# Patient Record
Sex: Female | Born: 1960 | Race: White | Hispanic: No | Marital: Married | Smoking: Never smoker
Health system: Southern US, Community
[De-identification: ages and names within clinical notes are randomized; demographics above are authoritative.]

---

## 1998-04-29 ENCOUNTER — Inpatient Hospital Stay (HOSPITAL_COMMUNITY): Admission: AD | Admit: 1998-04-29 | Discharge: 1998-05-02 | Payer: Self-pay | Admitting: Obstetrics and Gynecology

## 1998-05-02 ENCOUNTER — Encounter (HOSPITAL_COMMUNITY): Admission: RE | Admit: 1998-05-02 | Discharge: 1998-07-31 | Payer: Self-pay | Admitting: *Deleted

## 1998-06-05 ENCOUNTER — Other Ambulatory Visit: Admission: RE | Admit: 1998-06-05 | Discharge: 1998-06-05 | Payer: Self-pay | Admitting: Obstetrics and Gynecology

## 1999-11-12 ENCOUNTER — Other Ambulatory Visit: Admission: RE | Admit: 1999-11-12 | Discharge: 1999-11-12 | Payer: Self-pay | Admitting: Obstetrics and Gynecology

## 2000-02-23 ENCOUNTER — Emergency Department (HOSPITAL_COMMUNITY): Admission: EM | Admit: 2000-02-23 | Discharge: 2000-02-24 | Payer: Self-pay | Admitting: Emergency Medicine

## 2000-12-09 ENCOUNTER — Other Ambulatory Visit: Admission: RE | Admit: 2000-12-09 | Discharge: 2000-12-09 | Payer: Self-pay | Admitting: Obstetrics and Gynecology

## 2002-04-09 ENCOUNTER — Other Ambulatory Visit: Admission: RE | Admit: 2002-04-09 | Discharge: 2002-04-09 | Payer: Self-pay | Admitting: Obstetrics and Gynecology

## 2005-03-24 ENCOUNTER — Other Ambulatory Visit: Admission: RE | Admit: 2005-03-24 | Discharge: 2005-03-24 | Payer: Self-pay | Admitting: Obstetrics and Gynecology

## 2009-01-30 ENCOUNTER — Ambulatory Visit: Payer: Self-pay | Admitting: Sports Medicine

## 2009-01-30 DIAGNOSIS — M216X9 Other acquired deformities of unspecified foot: Secondary | ICD-10-CM | POA: Insufficient documentation

## 2009-01-30 DIAGNOSIS — M722 Plantar fascial fibromatosis: Secondary | ICD-10-CM | POA: Insufficient documentation

## 2009-03-17 ENCOUNTER — Ambulatory Visit: Payer: Self-pay | Admitting: Sports Medicine

## 2009-05-29 ENCOUNTER — Ambulatory Visit: Payer: Self-pay | Admitting: Sports Medicine

## 2010-01-21 DIAGNOSIS — R42 Dizziness and giddiness: Secondary | ICD-10-CM | POA: Insufficient documentation

## 2010-01-27 ENCOUNTER — Ambulatory Visit: Payer: Self-pay | Admitting: Sports Medicine

## 2010-03-17 NOTE — Assessment & Plan Note (Signed)
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 that did ice each evening  now doing strength work 3x week opposite days runs 3 x week  pain level now 3/10  cont to do exercises and stretches she is very consistent w this  Allergies: No Known Drug Allergies  Physical Exam  General:  Well-developed,well-nourished,in no acute distress; alert,appropriate and cooperative throughout examination Msk:  TTP only on 1 spot of medial heel on RT good motion arches are norm w slt pronation on RT Additional Exam:  MSK [place] The RT PF looks improved no calcification now down to 0.46 vs LT of 0.38 On last exam was 0.54 there is still some area of hypoechoic change  images saved   Impression & Recommendations:  Problem # 1:  PLANTAR FASCIITIS, RIGHT (ICD-728.71)  cont w exercise program cont icing cont stretches  grad build running   reck w repeat scan in 3 mos or so  Orders: [place] LIMITED (16109)  Problem # 2:  CAVUS DEFORMITY OF FOOT, 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) has good OTC insert she can cont with this  running form looks good in these  Patient Instructions: 1)  Ok to keep running 2)  keep up exercises for PF 3)  keep up icing 4)  Hal Higdon training schedules 5)  Rogers Blocker training schedules 6)  focus on beginning runners 7)  Basic principles: 8)  If you are at 30 mins the following would be good 9)  one day 30 mins - with 10 mins slightly faster than typical pace 10)  one day is 25 minurtes at easy pace 11)  one longer run - pace is not important but start at 40 mins and add 5 minutes every 2 weeks 12)  reck in 3 mos 13)  during that time if your pain level increase back down to  previous training level that was not painful

## 2010-03-17 NOTE — Assessment & Plan Note (Signed)
Summary: 4:00 APPT,dizziness while running per [place],[facility]   Vital Signs:  Patient profile:   50 year old female Pulse rate:   64 / minute BP sitting:   115 / 84  (left arm)  Vitals Entered By: Rochele Pages RN (January 21, 2010 4:02 PM) CC: dizziness and vomiting started during exercise   CC:  dizziness and vomiting started during exercise.  History of Present Illness: Pt presents to clinic for evaluation of severe dizziness that started during a boot camp class at 6 am.  She had not eaten breakfast.   Felt everything was spinning severely, and then she started vomiting.  The vomiting lasted for 12 hours.  BP at 9am that morning was 101/67 and blood sugar was 123- she had drank OJ when vomiting started- but vomited the OJ.  She had been driving from [facility] the day before the boot camp, and had not had much water that day while driving.   Dizziness continued- she saw Dr. Lambert Mody who did adjustment for BPPV and advised her to sleep in recliner for 2 days.  Symptoms seemed to improve, but not completely resolved.  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  These movements induced the dizziness.     Lying down on rt side improves symptoms.    Allergies: No Known Drug Allergies  Physical Exam  General:  Well-developed,well-nourished,in no acute distress; alert,appropriate and cooperative throughout examination Msk:  Negative Halpike maneuvers Slight dizziness at first when lying down Minor nystagmus with rapid eye movement Turning equilibrim exam negative Toe and heel walk with eyes open and eyes closed negative. No other abnormal neurological findings.   normal ENT exam   Impression & Recommendations:  Problem # 1:  DIZZINESS (ICD-780.4) I saw nothing on exam to suggest a serious cause for this could have BPV but does not seem liekly w norm exam today no neuro deficits  suggested watchful waiting whther triggered by dehydration or by viral process  would expect this to imporve by 14 days out  will reck if not  Patient Instructions: 1)  Hydrate with gatorade 2)  Do moderate walking or moderate running for the next week keeping heart rate in the 120s, do no more than 2 miles.  If heart races stop.    Orders Added: 1)  Est. Patient Level III [16109]

## 2010-03-17 NOTE — Assessment & Plan Note (Signed)
Summary: F/U R PLANTAR FASCIITIS,MC   Vital Signs:  Patient profile:   50 year old female BP sitting:   113 / 80  Vitals Entered By: Lillia Pauls CMA (March 17, 2009 8:46 AM)  History of Present Illness: 50 yo here for follow-up of right plantar fasciitis.  Seen 6 weeks ago at which time US showed thickening of right planatr fascia with calcifications.  Equivocal to slightly better.  Has been icing twice daily.  Doing "boot camp" for the past several weeks.  Has changed to new firmer superfeet insole with no improvement.  Not taking any meds.  Most pain with calf exercises.   However, she has beena ble to run up to a mile to 1.5 miles at boot camp; able to do most exercises; calf raises hurt the PF as does aerobic class; holding on aerobics.  Allergies: No Known Drug Allergies  Review of Systems      See HPI MS:  Denies joint pain and loss of strength.  Physical Exam  General:  Well-developed,well-nourished,in no acute distress; alert,appropriate and cooperative throughout examination Msk:  Patient is mildly tender at medial insertion of plantar fascia into calcaneus.  Gait with less pushoff right side-  improved with warmup, but overall normal. No limp.   Impression & Recommendations:  Problem # 1:  PLANTAR FASCIITIS, RIGHT (ICD-728.71)  No need for injection today as patient is improving.  Given cushioned comfort orthotic today.  Advised to continue exercise to a low pain level (3/10), continue icing and strethcing exercises.  Return in 2 months fo recheck.  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

## 2013-08-03 ENCOUNTER — Other Ambulatory Visit: Payer: Self-pay | Admitting: Physician Assistant

## 2013-08-03 DIAGNOSIS — R1013 Epigastric pain: Secondary | ICD-10-CM

## 2013-08-08 ENCOUNTER — Ambulatory Visit
Admission: RE | Admit: 2013-08-08 | Discharge: 2013-08-08 | Disposition: A | Discharge status: Home / Self Care | Payer: BC Managed Care – PPO | Source: Ambulatory Visit | Attending: Physician Assistant | Admitting: Physician Assistant

## 2013-08-08 DIAGNOSIS — R1013 Epigastric pain: Secondary | ICD-10-CM

## 2015-11-05 DIAGNOSIS — M25519 Pain in unspecified shoulder: Secondary | ICD-10-CM | POA: Diagnosis not present

## 2015-11-05 DIAGNOSIS — L409 Psoriasis, unspecified: Secondary | ICD-10-CM | POA: Diagnosis not present

## 2015-11-06 DIAGNOSIS — M7541 Impingement syndrome of right shoulder: Secondary | ICD-10-CM | POA: Diagnosis not present

## 2015-11-06 DIAGNOSIS — M25551 Pain in right hip: Secondary | ICD-10-CM | POA: Diagnosis not present

## 2015-11-13 DIAGNOSIS — M25511 Pain in right shoulder: Secondary | ICD-10-CM | POA: Diagnosis not present

## 2015-11-13 DIAGNOSIS — M7541 Impingement syndrome of right shoulder: Secondary | ICD-10-CM | POA: Diagnosis not present

## 2015-11-20 DIAGNOSIS — M25511 Pain in right shoulder: Secondary | ICD-10-CM | POA: Diagnosis not present

## 2015-11-20 DIAGNOSIS — M7541 Impingement syndrome of right shoulder: Secondary | ICD-10-CM | POA: Diagnosis not present

## 2016-02-26 IMAGING — US US ABDOMEN COMPLETE
1 series · 14 of 25 positions shown · non-contrast
Comparison: None.

CLINICAL DATA: Epigastric abdominal pain

EXAM:
ULTRASOUND ABDOMEN COMPLETE

[Series 1: us abdomen complete · 0.29mm/px · 14 of 92 slices shown]
[im 1/92]
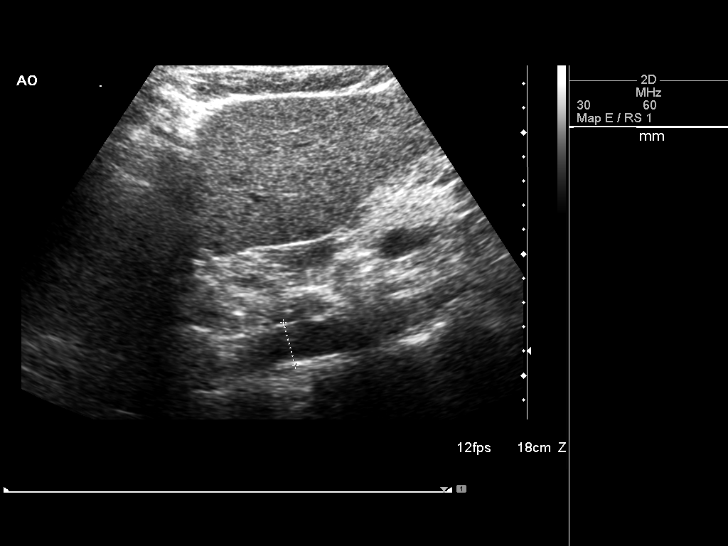
[im 8/92]
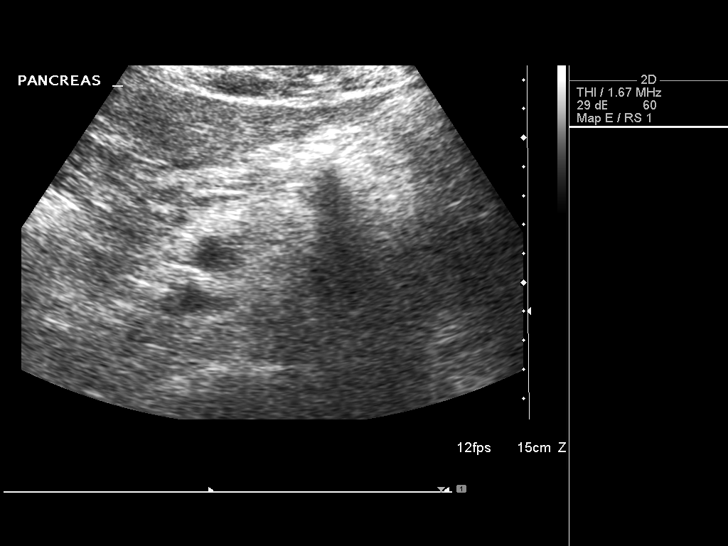
[im 16/92]
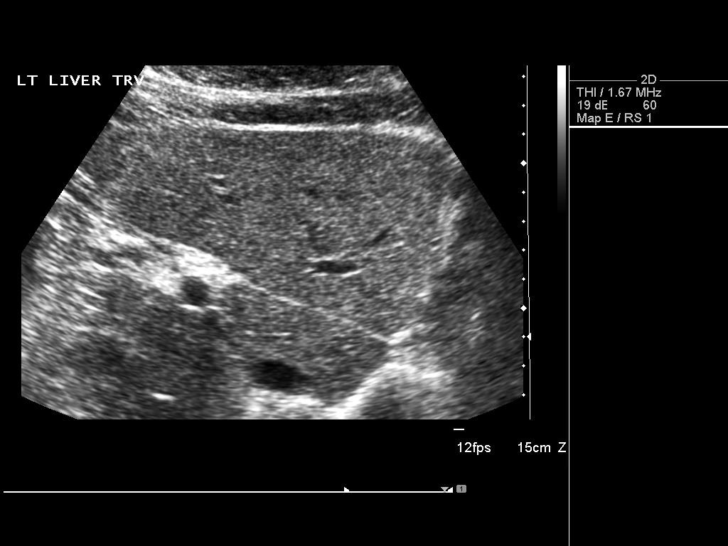
[im 23/92]
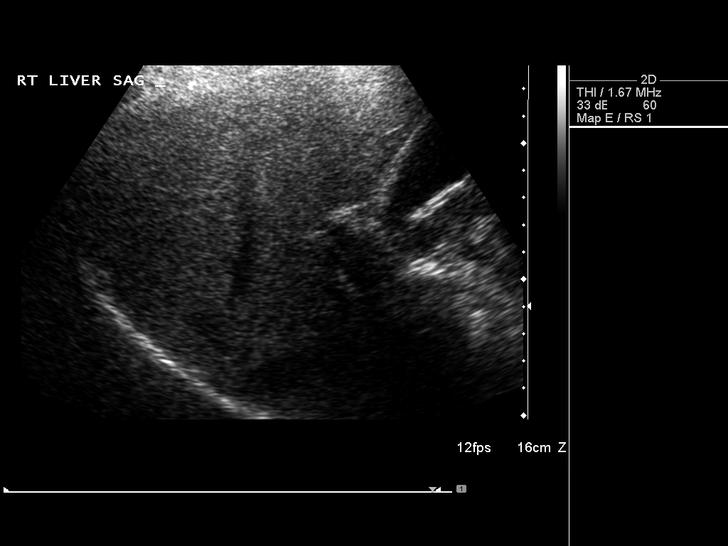
[im 31/92]
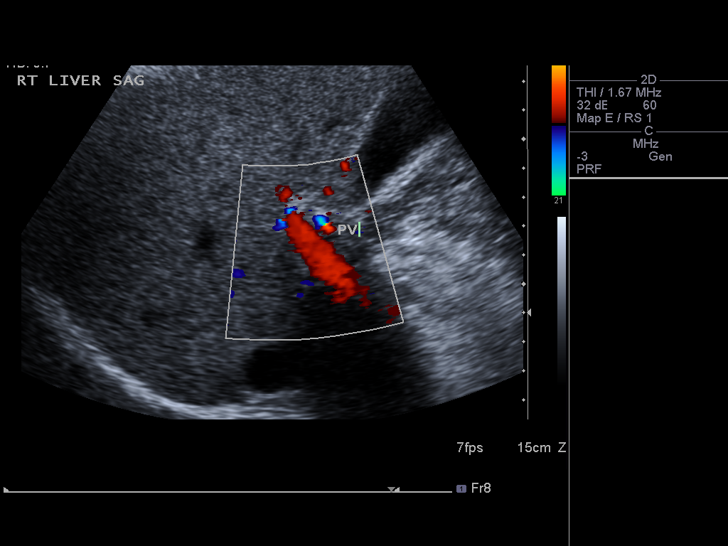
[im 35/92]
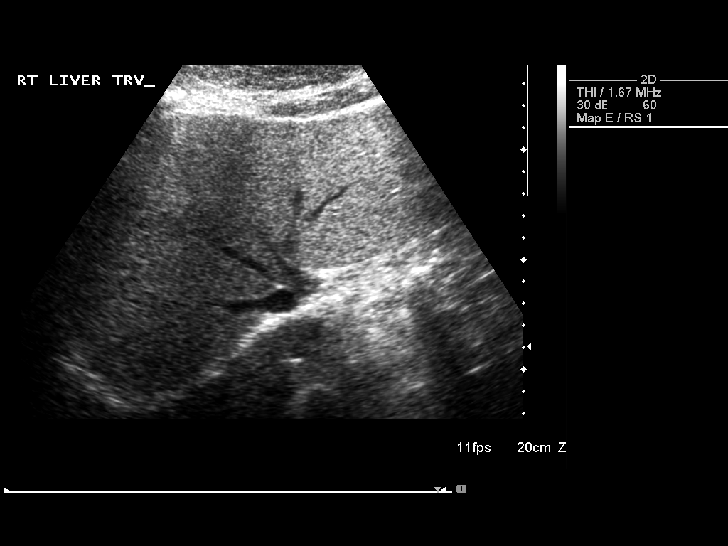
[im 42/92]
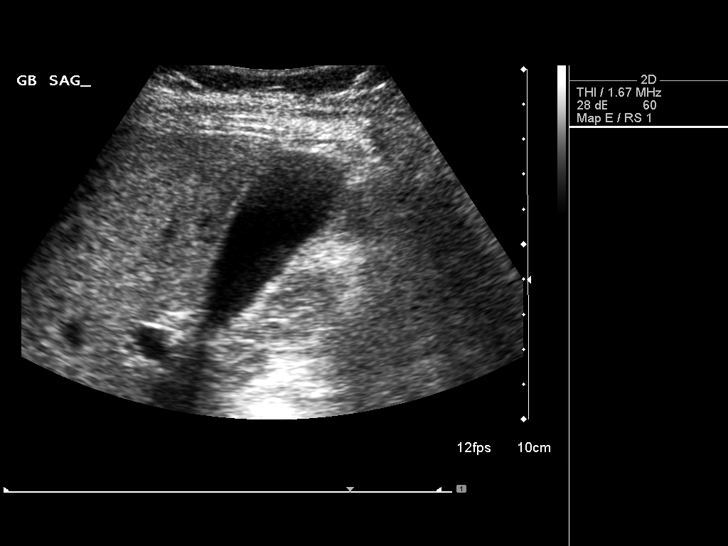
[im 50/92]
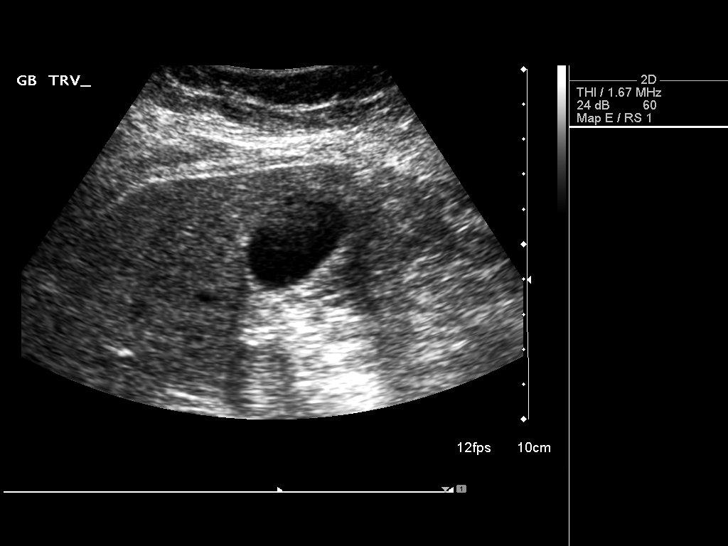
[im 57/92]
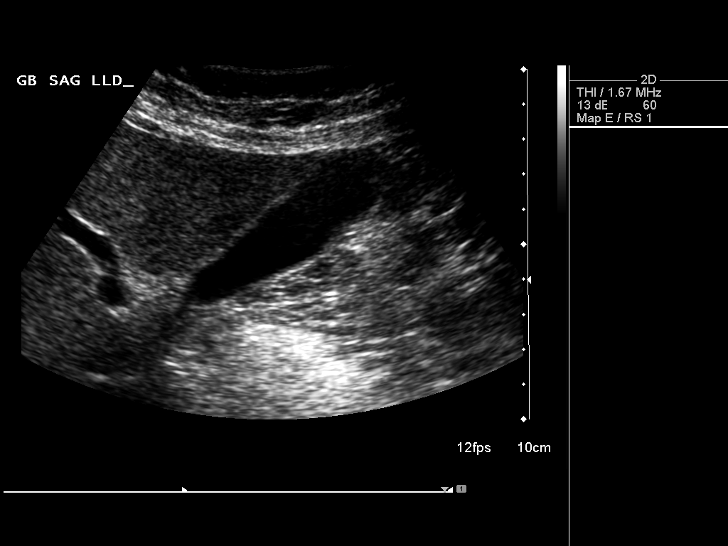
[im 61/92]
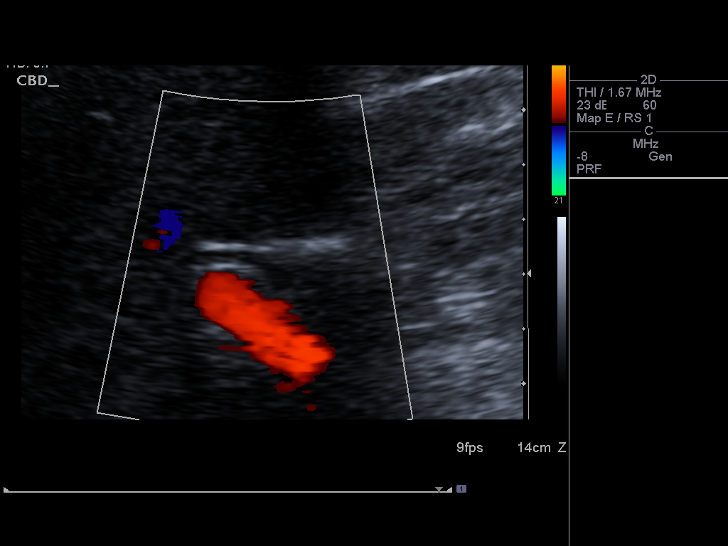
[im 69/92]
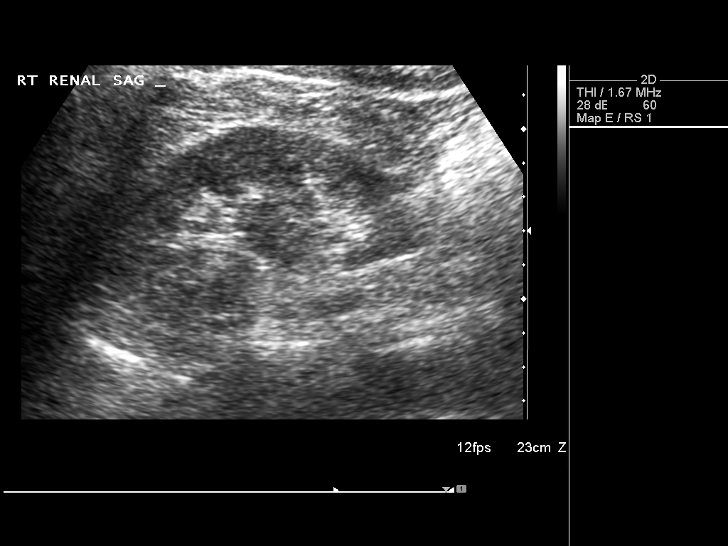
[im 76/92]
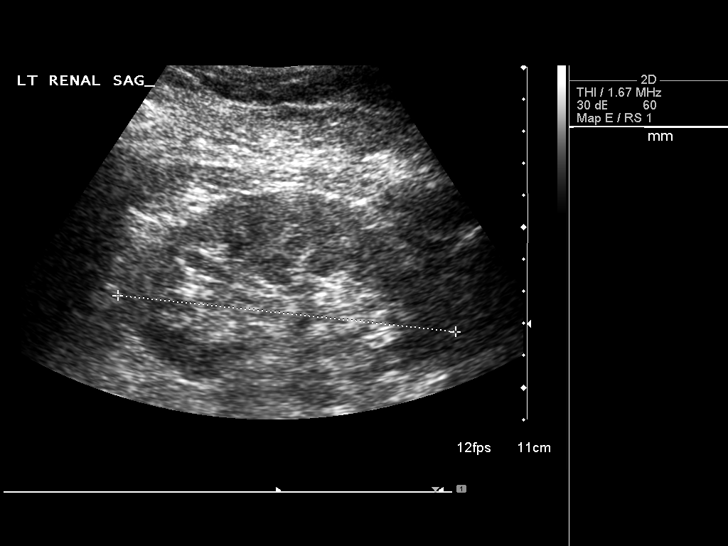
[im 84/92]
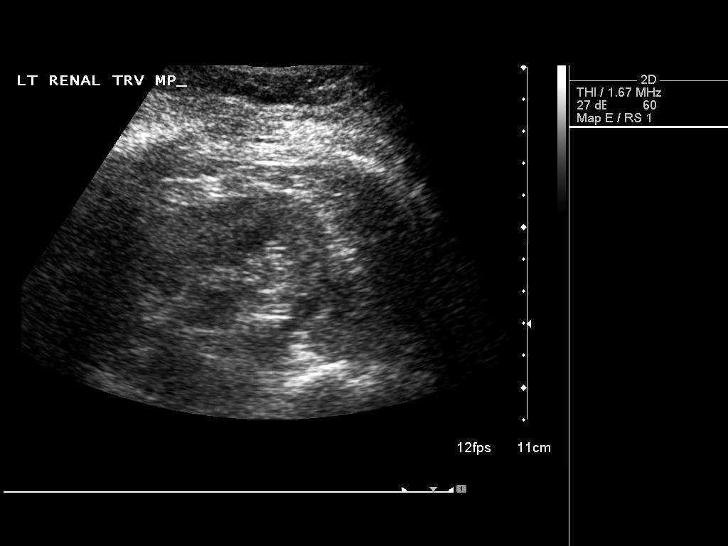
[im 92/92]
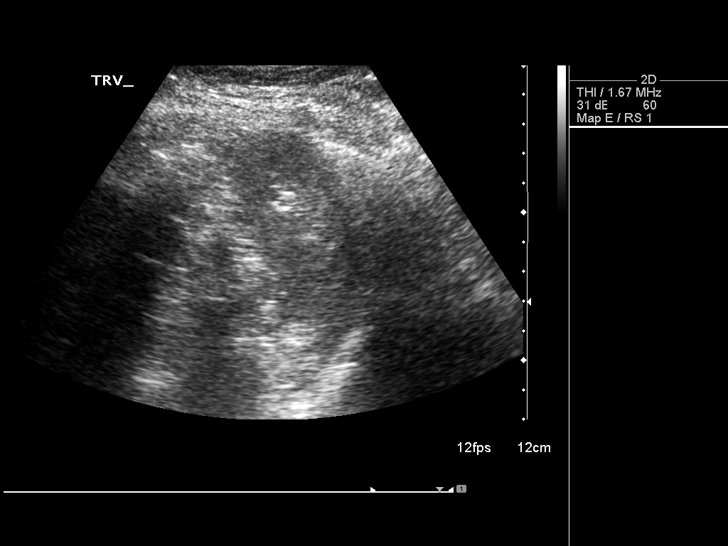

[14 of 25 positions shown; findings below may reference images not displayed]

FINDINGS: Gallbladder:

The gallbladder is visualized and no gallstones are noted. There is
no pain over the gallbladder with compression.

Common bile duct:

Diameter: The common bile duct is normal measuring 4.7 mm in
diameter.

Liver:

The liver has a normal echogenic pattern. No focal abnormality is
seen.

IVC:

No abnormality visualized.

Pancreas:

The pancreas appears normal in the midbody with portions of the head
and tail of the pancreas obscured by bowel gas.

Spleen:

The spleen is normal measuring 4.3 cm sagittally.

Right Kidney:

Length: 9.6 cm..  No hydronephrosis is seen.

Left Kidney:

Length: 10.6 cm..  No hydronephrosis is noted.

Abdominal aorta:

The abdominal aorta is normal caliber.

Other findings:

None.
IMPRESSION: Negative abdominal ultrasound. Portions of the pancreas are obscured
by bowel gas.

## 2016-12-20 DIAGNOSIS — L409 Psoriasis, unspecified: Secondary | ICD-10-CM | POA: Diagnosis not present

## 2017-01-04 DIAGNOSIS — L400 Psoriasis vulgaris: Secondary | ICD-10-CM | POA: Diagnosis not present

## 2017-01-28 DIAGNOSIS — Z1231 Encounter for screening mammogram for malignant neoplasm of breast: Secondary | ICD-10-CM | POA: Diagnosis not present

## 2017-05-31 DIAGNOSIS — L409 Psoriasis, unspecified: Secondary | ICD-10-CM | POA: Diagnosis not present

## 2017-05-31 DIAGNOSIS — R490 Dysphonia: Secondary | ICD-10-CM | POA: Diagnosis not present

## 2018-07-12 DIAGNOSIS — Z03818 Encounter for observation for suspected exposure to other biological agents ruled out: Secondary | ICD-10-CM | POA: Diagnosis not present

## 2019-02-02 DIAGNOSIS — B349 Viral infection, unspecified: Secondary | ICD-10-CM | POA: Diagnosis not present

## 2019-02-14 DIAGNOSIS — R05 Cough: Secondary | ICD-10-CM | POA: Diagnosis not present

## 2019-02-15 DIAGNOSIS — R0789 Other chest pain: Secondary | ICD-10-CM | POA: Diagnosis not present

## 2019-03-14 DIAGNOSIS — Z209 Contact with and (suspected) exposure to unspecified communicable disease: Secondary | ICD-10-CM | POA: Diagnosis not present

## 2019-09-10 DIAGNOSIS — Z23 Encounter for immunization: Secondary | ICD-10-CM | POA: Diagnosis not present

## 2019-11-16 DIAGNOSIS — L409 Psoriasis, unspecified: Secondary | ICD-10-CM | POA: Diagnosis not present

## 2019-11-16 DIAGNOSIS — Z23 Encounter for immunization: Secondary | ICD-10-CM | POA: Diagnosis not present

## 2020-01-16 DIAGNOSIS — Z23 Encounter for immunization: Secondary | ICD-10-CM | POA: Diagnosis not present

## 2020-01-16 DIAGNOSIS — Z0000 Encounter for general adult medical examination without abnormal findings: Secondary | ICD-10-CM | POA: Diagnosis not present

## 2020-01-16 DIAGNOSIS — R7309 Other abnormal glucose: Secondary | ICD-10-CM | POA: Diagnosis not present

## 2020-01-16 DIAGNOSIS — E7800 Pure hypercholesterolemia, unspecified: Secondary | ICD-10-CM | POA: Diagnosis not present

## 2020-01-16 DIAGNOSIS — E559 Vitamin D deficiency, unspecified: Secondary | ICD-10-CM | POA: Diagnosis not present

## 2020-01-28 DIAGNOSIS — Z713 Dietary counseling and surveillance: Secondary | ICD-10-CM | POA: Diagnosis not present

## 2020-02-01 DIAGNOSIS — A084 Viral intestinal infection, unspecified: Secondary | ICD-10-CM | POA: Diagnosis not present

## 2020-02-02 DIAGNOSIS — Z03818 Encounter for observation for suspected exposure to other biological agents ruled out: Secondary | ICD-10-CM | POA: Diagnosis not present

## 2020-02-03 ENCOUNTER — Emergency Department (HOSPITAL_COMMUNITY): Payer: BC Managed Care – PPO

## 2020-02-03 ENCOUNTER — Inpatient Hospital Stay (HOSPITAL_COMMUNITY)
Admission: EM | Admit: 2020-02-03 | Discharge: 2020-02-06 | DRG: 419 | Disposition: A | Discharge status: Home / Self Care | Payer: BC Managed Care – PPO | Attending: Surgery | Admitting: Surgery

## 2020-02-03 ENCOUNTER — Other Ambulatory Visit: Payer: Self-pay

## 2020-02-03 DIAGNOSIS — K8066 Calculus of gallbladder and bile duct with acute and chronic cholecystitis without obstruction: Secondary | ICD-10-CM | POA: Diagnosis not present

## 2020-02-03 DIAGNOSIS — K819 Cholecystitis, unspecified: Secondary | ICD-10-CM

## 2020-02-03 DIAGNOSIS — K810 Acute cholecystitis: Secondary | ICD-10-CM | POA: Diagnosis present

## 2020-02-03 DIAGNOSIS — Z888 Allergy status to other drugs, medicaments and biological substances status: Secondary | ICD-10-CM

## 2020-02-03 DIAGNOSIS — R932 Abnormal findings on diagnostic imaging of liver and biliary tract: Secondary | ICD-10-CM | POA: Diagnosis not present

## 2020-02-03 DIAGNOSIS — M722 Plantar fascial fibromatosis: Secondary | ICD-10-CM | POA: Diagnosis not present

## 2020-02-03 DIAGNOSIS — K760 Fatty (change of) liver, not elsewhere classified: Secondary | ICD-10-CM | POA: Diagnosis not present

## 2020-02-03 DIAGNOSIS — K828 Other specified diseases of gallbladder: Secondary | ICD-10-CM | POA: Diagnosis present

## 2020-02-03 DIAGNOSIS — K8000 Calculus of gallbladder with acute cholecystitis without obstruction: Secondary | ICD-10-CM | POA: Diagnosis not present

## 2020-02-03 DIAGNOSIS — Z6831 Body mass index (BMI) 31.0-31.9, adult: Secondary | ICD-10-CM

## 2020-02-03 DIAGNOSIS — E669 Obesity, unspecified: Secondary | ICD-10-CM | POA: Diagnosis not present

## 2020-02-03 DIAGNOSIS — Z20822 Contact with and (suspected) exposure to covid-19: Secondary | ICD-10-CM | POA: Diagnosis not present

## 2020-02-03 DIAGNOSIS — K8020 Calculus of gallbladder without cholecystitis without obstruction: Secondary | ICD-10-CM | POA: Diagnosis not present

## 2020-02-03 DIAGNOSIS — Z419 Encounter for procedure for purposes other than remedying health state, unspecified: Secondary | ICD-10-CM

## 2020-02-03 DIAGNOSIS — K8050 Calculus of bile duct without cholangitis or cholecystitis without obstruction: Secondary | ICD-10-CM | POA: Diagnosis not present

## 2020-02-03 DIAGNOSIS — R7401 Elevation of levels of liver transaminase levels: Secondary | ICD-10-CM | POA: Diagnosis not present

## 2020-02-03 DIAGNOSIS — K8010 Calculus of gallbladder with chronic cholecystitis without obstruction: Secondary | ICD-10-CM | POA: Diagnosis not present

## 2020-02-03 DIAGNOSIS — K8062 Calculus of gallbladder and bile duct with acute cholecystitis without obstruction: Secondary | ICD-10-CM | POA: Diagnosis not present

## 2020-02-03 DIAGNOSIS — Z9889 Other specified postprocedural states: Secondary | ICD-10-CM | POA: Diagnosis not present

## 2020-02-03 LAB — URINALYSIS, ROUTINE W REFLEX MICROSCOPIC
Bacteria, UA: NONE SEEN
Bilirubin Urine: NEGATIVE
Glucose, UA: NEGATIVE mg/dL
Ketones, ur: 80 mg/dL — AB
Leukocytes,Ua: NEGATIVE
Nitrite: NEGATIVE
Protein, ur: NEGATIVE mg/dL
Specific Gravity, Urine: 1.011 (ref 1.005–1.030)
pH: 6 (ref 5.0–8.0)

## 2020-02-03 LAB — COMPREHENSIVE METABOLIC PANEL
ALT: 671 U/L — ABNORMAL HIGH (ref 0–44)
AST: 418 U/L — ABNORMAL HIGH (ref 15–41)
Albumin: 3.9 g/dL (ref 3.5–5.0)
Alkaline Phosphatase: 193 U/L — ABNORMAL HIGH (ref 38–126)
Anion gap: 12 (ref 5–15)
BUN: 17 mg/dL (ref 6–20)
CO2: 27 mmol/L (ref 22–32)
Calcium: 9.7 mg/dL (ref 8.9–10.3)
Chloride: 97 mmol/L — ABNORMAL LOW (ref 98–111)
Creatinine, Ser: 1.02 mg/dL — ABNORMAL HIGH (ref 0.44–1.00)
GFR, Estimated: >60 mL/min (ref >60)
Glucose, Bld: 155 mg/dL — ABNORMAL HIGH (ref 70–99)
Potassium: 3.9 mmol/L (ref 3.5–5.1)
Sodium: 136 mmol/L (ref 135–145)
Total Bilirubin: 3.1 mg/dL — ABNORMAL HIGH (ref 0.3–1.2)
Total Protein: 8 g/dL (ref 6.5–8.1)

## 2020-02-03 LAB — RESP PANEL BY RT-PCR (FLU A&B, COVID) ARPGX2
Influenza A by PCR: NEGATIVE
Influenza B by PCR: NEGATIVE
SARS Coronavirus 2 by RT PCR: NEGATIVE

## 2020-02-03 LAB — CBC
HCT: 51.8 % — ABNORMAL HIGH (ref 36.0–46.0)
Hemoglobin: 16.9 g/dL — ABNORMAL HIGH (ref 12.0–15.0)
MCH: 28.2 pg (ref 26.0–34.0)
MCHC: 32.6 g/dL (ref 30.0–36.0)
MCV: 86.3 fL (ref 80.0–100.0)
Platelets: 378 10*3/uL (ref 150–400)
RBC: 6 MIL/uL — ABNORMAL HIGH (ref 3.87–5.11)
RDW: 13.6 % (ref 11.5–15.5)
WBC: 11.3 10*3/uL — ABNORMAL HIGH (ref 4.0–10.5)
nRBC: 0 % (ref 0.0–0.2)

## 2020-02-03 LAB — HEPATITIS PANEL, ACUTE
HCV Ab: NONREACTIVE
Hep A IgM: NONREACTIVE
Hep B C IgM: NONREACTIVE
Hepatitis B Surface Ag: NONREACTIVE

## 2020-02-03 LAB — I-STAT BETA HCG BLOOD, ED (MC, WL, AP ONLY): I-stat hCG, quantitative: <5 m[IU]/mL (ref <5)

## 2020-02-03 LAB — LIPASE, BLOOD: Lipase: 19 U/L (ref 11–51)

## 2020-02-03 MED ORDER — ONDANSETRON 4 MG PO TBDP
4.0000 mg | ORAL_TABLET | Freq: Four times a day (QID) | ORAL | Status: DC | PRN
  Administered 2020-02-04: 4 mg via ORAL
  Filled 2020-02-03: qty 1

## 2020-02-03 MED ORDER — FENTANYL CITRATE (PF) 100 MCG/2ML IJ SOLN
50.0000 ug | INTRAMUSCULAR | Status: DC | PRN
  Administered 2020-02-04: 50 ug via INTRAVENOUS
  Filled 2020-02-03: qty 2

## 2020-02-03 MED ORDER — ONDANSETRON HCL 4 MG/2ML IJ SOLN
4.0000 mg | Freq: Four times a day (QID) | INTRAMUSCULAR | Status: DC | PRN
  Administered 2020-02-04 – 2020-02-05 (×4): 4 mg via INTRAVENOUS
  Filled 2020-02-03 (×4): qty 2

## 2020-02-03 MED ORDER — MORPHINE SULFATE (PF) 4 MG/ML IV SOLN
4.0000 mg | Freq: Once | INTRAVENOUS | Status: AC
  Administered 2020-02-03: 4 mg via INTRAVENOUS
  Filled 2020-02-03: qty 1

## 2020-02-03 MED ORDER — ENOXAPARIN SODIUM 40 MG/0.4ML ~~LOC~~ SOLN
40.0000 mg | SUBCUTANEOUS | Status: DC
  Administered 2020-02-03: 40 mg via SUBCUTANEOUS
  Filled 2020-02-03: qty 0.4

## 2020-02-03 MED ORDER — ONDANSETRON HCL 4 MG/2ML IJ SOLN
4.0000 mg | Freq: Once | INTRAMUSCULAR | Status: AC
  Administered 2020-02-03: 4 mg via INTRAVENOUS
  Filled 2020-02-03: qty 2

## 2020-02-03 MED ORDER — ZOLPIDEM TARTRATE 5 MG PO TABS: 5.0000 mg | ORAL_TABLET | Freq: Every evening | ORAL | Status: DC | PRN

## 2020-02-03 MED ORDER — OXYCODONE HCL 5 MG PO TABS
5.0000 mg | ORAL_TABLET | ORAL | Status: DC | PRN
  Administered 2020-02-04: 10 mg via ORAL
  Filled 2020-02-03 (×3): qty 2

## 2020-02-03 MED ORDER — LACTATED RINGERS IV BOLUS
1000.0000 mL | Freq: Once | INTRAVENOUS | Status: AC
  Administered 2020-02-03: 1000 mL via INTRAVENOUS

## 2020-02-03 MED ORDER — METRONIDAZOLE 500 MG PO TABS
500.0000 mg | ORAL_TABLET | Freq: Three times a day (TID) | ORAL | Status: DC
  Administered 2020-02-03: 500 mg via ORAL
  Filled 2020-02-03: qty 1

## 2020-02-03 MED ORDER — SODIUM CHLORIDE 0.9 % IV SOLN
2.0000 g | Freq: Once | INTRAVENOUS | Status: AC
  Administered 2020-02-03: 2 g via INTRAVENOUS
  Filled 2020-02-03: qty 20

## 2020-02-03 MED ORDER — KCL IN DEXTROSE-NACL 20-5-0.9 MEQ/L-%-% IV SOLN
INTRAVENOUS | Status: DC
  Filled 2020-02-03 (×3): qty 1000

## 2020-02-03 NOTE — H&P (Signed)
Katie Keller is an 59 y.o. female.   Chief Complaint: Abdominal pain HPI: Patient presents to the emergency room with a 3-day history of epigastric abdominal pain.  Pain started between Thursday and Friday.  It got a little bit better Friday but returned yesterday with nausea vomiting and more severe today.  Pain is sharp in nature location right upper quadrant made worse with eating.  Ultrasound obtained which shows gallbladder wall thickening and gallstones.  She has a white count 11,100 and elevated bilirubin of 3.1.  Denies any yellowing of the skin or eyes.  Denies itching  No past medical history on file.    No family history on file. Social History:  has no history on file for tobacco use, alcohol use, and drug use.  Allergies:  Allergies  Allergen Reactions  . Pravachol [Pravastatin]     (Not in a hospital admission)   Results for orders placed or performed during the hospital encounter of 02/03/20 (from the past 48 hour(s))  Lipase, blood     Status: None   Collection Time: 02/03/20 10:23 AM  Result Value Ref Range   Lipase 19 11 - 51 U/L    Comment: Performed at [facility], [address] [place], [place] [address]  Comprehensive metabolic panel     Status: Abnormal   Collection Time: 02/03/20 10:23 AM  Result Value Ref Range   Sodium 136 135 - 145 mmol/L   Potassium 3.9 3.5 - 5.1 mmol/L   Chloride 97 (L) 98 - 111 mmol/L   CO2 27 22 - 32 mmol/L   Glucose, Bld 155 (H) 70 - 99 mg/dL    Comment: Glucose reference range applies only to samples taken after fasting for at least 8 hours.   BUN 17 6 - 20 mg/dL   Creatinine, Ser 8.52 (H) 0.44 - 1.00 mg/dL   Calcium 9.7 8.9 - 77.8 mg/dL   Total Protein 8.0 6.5 - 8.1 g/dL   Albumin 3.9 3.5 - 5.0 g/dL   AST 242 (H) 15 - 41 U/L   ALT 671 (H) 0 - 44 U/L   Alkaline Phosphatase 193 (H) 38 - 126 U/L   Total Bilirubin 3.1 (H) 0.3 - 1.2 mg/dL   GFR, Estimated >35 >36 mL/min    Comment: (NOTE) Calculated  using the CKD-EPI Creatinine Equation (2021)    Anion gap 12 5 - 15    Comment: Performed at [facility], [address] [place], [place] [address]  CBC     Status: Abnormal   Collection Time: 02/03/20 10:23 AM  Result Value Ref Range   WBC 11.3 (H) 4.0 - 10.5 K/uL   RBC 6.00 (H) 3.87 - 5.11 MIL/uL   Hemoglobin 16.9 (H) 12.0 - 15.0 g/dL   HCT 54.0 (H) 08.6 - 76.1 %   MCV 86.3 80.0 - 100.0 fL   MCH 28.2 26.0 - 34.0 pg   MCHC 32.6 30.0 - 36.0 g/dL   RDW 95.0 93.2 - 67.1 %   Platelets 378 150 - 400 K/uL   nRBC 0.0 0.0 - 0.2 %    Comment: Performed at [facility], [address], [place], [place] [address]  I-Stat beta hCG blood, ED     Status: None   Collection Time: 02/03/20 10:30 AM  Result Value Ref Range   I-stat hCG, quantitative <5.0 <5 mIU/mL   Comment 3            Comment:  GEST. AGE      CONC.  (mIU/mL)   <=1 WEEK        5 - 50     2 WEEKS       50 - 500     3 WEEKS       100 - 10,000     4 WEEKS     1,000 - 30,000        FEMALE AND NON-PREGNANT FEMALE:     LESS THAN 5 mIU/mL   Resp Panel by RT-PCR (Flu A&B, Covid) Nasopharyngeal Swab     Status: None   Collection Time: 02/03/20  2:09 PM   Specimen: Nasopharyngeal Swab; Nasopharyngeal(NP) swabs in vial transport medium  Result Value Ref Range   SARS Coronavirus 2 by RT PCR NEGATIVE NEGATIVE    Comment: (NOTE) SARS-CoV-2 target nucleic acids are NOT DETECTED.  The SARS-CoV-2 RNA is generally detectable in upper respiratory specimens during the acute phase of infection. The lowest concentration of SARS-CoV-2 viral copies this assay can detect is 138 copies/mL. A negative result does not preclude SARS-Cov-2 infection and should not be used as the sole basis for treatment or other patient management decisions. A negative result may occur with  improper specimen collection/handling, submission of specimen other than nasopharyngeal swab, presence of viral mutation(s) within  the areas targeted by this assay, and inadequate number of viral copies(<138 copies/mL). A negative result must be combined with clinical observations, patient history, and epidemiological information. The expected result is Negative.  Fact Sheet for Patients:  BloggerCourse.com  Fact Sheet for Healthcare Providers:  SeriousBroker.it  This test is no t yet approved or cleared by the [place] FDA and  has been authorized for detection and/or diagnosis of SARS-CoV-2 by FDA under an Emergency Use Authorization (EUA). This EUA will remain  in effect (meaning this test can be used) for the duration of the COVID-19 declaration under Section 564(b)(1) of the Act, 21 U.S.C.section 360bbb-3(b)(1), unless the authorization is terminated  or revoked sooner.       Influenza A by PCR NEGATIVE NEGATIVE   Influenza B by PCR NEGATIVE NEGATIVE    Comment: (NOTE) The Xpert Xpress SARS-CoV-2/FLU/RSV plus assay is intended as an aid in the diagnosis of influenza from Nasopharyngeal swab specimens and should not be used as a sole basis for treatment. Nasal washings and aspirates are unacceptable for Xpert Xpress SARS-CoV-2/FLU/RSV testing.  Fact Sheet for Patients: BloggerCourse.com  Fact Sheet for Healthcare Providers: SeriousBroker.it  This test is not yet approved or cleared by the [place] FDA and has been authorized for detection and/or diagnosis of SARS-CoV-2 by FDA under an Emergency Use Authorization (EUA). This EUA will remain in effect (meaning this test can be used) for the duration of the COVID-19 declaration under Section 564(b)(1) of the Act, 21 U.S.C. section 360bbb-3(b)(1), unless the authorization is terminated or revoked.  Performed at [facility], [address], [place], [place] [address]   Urinalysis, Routine w reflex microscopic     Status:  Abnormal   Collection Time: 02/03/20  3:50 PM  Result Value Ref Range   Color, Urine AMBER (A) YELLOW    Comment: BIOCHEMICALS MAY BE AFFECTED BY COLOR   APPearance CLEAR CLEAR   Specific Gravity, Urine 1.011 1.005 - 1.030   pH 6.0 5.0 - 8.0   Glucose, UA NEGATIVE NEGATIVE mg/dL   Hgb urine dipstick SMALL (A) NEGATIVE   Bilirubin Urine NEGATIVE NEGATIVE   Ketones, ur 80 (A)  NEGATIVE mg/dL   Protein, ur NEGATIVE NEGATIVE mg/dL   Nitrite NEGATIVE NEGATIVE   Leukocytes,Ua NEGATIVE NEGATIVE   RBC / HPF 0-5 0 - 5 RBC/hpf   WBC, UA 6-10 0 - 5 WBC/hpf   Bacteria, UA NONE SEEN NONE SEEN   Squamous Epithelial / LPF 0-5 0 - 5   Mucus PRESENT     Comment: Performed at [facility], [address] [place], [place] [address]   US Abdomen Limited RUQ (LIVER/GB)  Result Date: 02/03/2020 CLINICAL DATA:  RIGHT upper quadrant/epigastric pain EXAM: ULTRASOUND ABDOMEN LIMITED RIGHT UPPER QUADRANT COMPARISON:  August 08, 2013 FINDINGS: Gallbladder: There are multiple gallstones. There is gallbladder wall thickening. The gallbladder is distended. No definitive pericholecystic fluid identified. Sonographic Eulah Pont sign is negative. Common bile duct: Diameter: 7 mm, the upper limits of normal Liver: Diffusely increased hepatic echogenicity. Portal vein is patent on color Doppler imaging with normal direction of blood flow towards the liver. Other: None. IMPRESSION: 1. Cholelithiasis and gallbladder wall thickening within a distended gallbladder. Although sonographic Eulah Pont sign was negative, findings are concerning for acute cholecystitis. 2. Hepatic steatosis. Electronically Signed   By: Meda Klinefelter MD   On: 02/03/2020 14:59    Review of Systems  Gastrointestinal: Positive for abdominal pain and nausea.  All other systems reviewed and are negative.   Blood pressure 105/68, pulse 67, temperature 97.7 F (36.5 C), temperature source Oral, resp. rate 11, SpO2 94 %. Physical  Exam Constitutional:      Appearance: She is well-developed.  HENT:     Head: Normocephalic.  Eyes:     Extraocular Movements: Extraocular movements intact.     Pupils: Pupils are equal, round, and reactive to light.  Pulmonary:     Effort: Pulmonary effort is normal.     Breath sounds: Normal breath sounds.  Abdominal:     Tenderness: There is abdominal tenderness in the right upper quadrant.  Musculoskeletal:        General: Normal range of motion.  Skin:    General: Skin is warm and dry.  Neurological:     General: No focal deficit present.     Mental Status: She is oriented to person, place, and time.  Psychiatric:        Mood and Affect: Mood normal.        Behavior: Behavior normal.      Assessment/Plan Acute cholecystitis  Admit for IV fluids, IV antibiotics and clear liquids until midnight.  Plan will be laparoscopic cholecystectomy sometime in the next 24 hours.  Dr. Gerrit Friends to assess in a.m.  Elevated liver function studies-recheck in a.m.  May require MRCP versus intraoperative cholangiogram.  Dortha Schwalbe, MD 02/03/2020, 4:42 PM

## 2020-02-03 NOTE — ED Notes (Signed)
Patient provided with gingerale.

## 2020-02-03 NOTE — ED Provider Notes (Signed)
[name]-EMERGENCY DEPT Provider Note   CSN: [id number] Arrival date & time: 02/03/20  1638     History Chief Complaint  Patient presents with   Abdominal Pain    Katie Keller is a 59 y.o. female.   Abdominal Pain Pain location:  Epigastric and periumbilical Pain quality: aching   Pain radiates to:  Does not radiate Pain severity:  Moderate Onset quality:  Gradual Timing:  Constant Progression:  Worsening Chronicity:  New Context: recent illness   Relieved by: anit-emetics. Worsened by:  Nothing Ineffective treatments:  None tried Associated symptoms: anorexia, chills, nausea and vomiting   Associated symptoms: no chest pain, no cough, no diarrhea, no dysuria, no fever (subjective) and no shortness of breath        No past medical history on file.  Patient Active Problem List   Diagnosis Date Noted   DIZZINESS 01/21/2010   PLANTAR FASCIITIS, RIGHT 01/30/2009   CAVUS DEFORMITY OF FOOT, ACQUIRED 01/30/2009       OB History   No obstetric history on file.     No family history on file.     Home Medications Prior to Admission medications   Not on File    Allergies    Pravachol [pravastatin]  Review of Systems   Review of Systems  Constitutional: Positive for appetite change and chills. Negative for fever (subjective).  HENT: Negative for congestion and rhinorrhea.   Respiratory: Negative for cough and shortness of breath.   Cardiovascular: Negative for chest pain and palpitations.  Gastrointestinal: Positive for abdominal pain, anorexia, nausea and vomiting. Negative for diarrhea.  Genitourinary: Negative for difficulty urinating and dysuria.  Musculoskeletal: Negative for arthralgias and back pain.  Skin: Negative for rash and wound.  Neurological: Negative for light-headedness and headaches.    Physical Exam Updated Vital Signs BP (!) 151/85    Pulse 66    Temp 97.7 F (36.5 C) (Oral)    Resp 18    SpO2 98%    Physical Exam Vitals and nursing note reviewed. Exam conducted with a chaperone present.  Constitutional:      General: She is not in acute distress.    Appearance: Normal appearance.  HENT:     Head: Normocephalic and atraumatic.     Nose: No rhinorrhea.  Eyes:     General:        Right eye: No discharge.        Left eye: No discharge.     Conjunctiva/sclera: Conjunctivae normal.  Cardiovascular:     Rate and Rhythm: Normal rate and regular rhythm.  Pulmonary:     Effort: Pulmonary effort is normal. No respiratory distress.     Breath sounds: No stridor.  Abdominal:     General: Abdomen is flat. There is no distension.     Palpations: Abdomen is soft.     Tenderness: There is abdominal tenderness in the right upper quadrant and epigastric area. There is no guarding or rebound. Positive signs include Murphy's sign. Negative signs include Rovsing's sign, McBurney's sign and psoas sign.  Musculoskeletal:        General: No tenderness or signs of injury.  Skin:    General: Skin is warm and dry.  Neurological:     General: No focal deficit present.     Mental Status: She is alert. Mental status is at baseline.     Motor: No weakness.  Psychiatric:        Mood and Affect: Mood normal.  Behavior: Behavior normal.     ED Results / Procedures / Treatments   Labs (all labs ordered are listed, but only abnormal results are displayed) Labs Reviewed  COMPREHENSIVE METABOLIC PANEL - Abnormal; Notable for the following components:      Result Value   Chloride 97 (*)    Glucose, Bld 155 (*)    Creatinine, Ser 1.02 (*)    AST 418 (*)    ALT 671 (*)    Alkaline Phosphatase 193 (*)    Total Bilirubin 3.1 (*)    All other components within normal limits  CBC - Abnormal; Notable for the following components:   WBC 11.3 (*)    RBC 6.00 (*)    Hemoglobin 16.9 (*)    HCT 51.8 (*)    All other components within normal limits  RESP PANEL BY RT-PCR (FLU A&B, COVID) [id number]   LIPASE, BLOOD  URINALYSIS, ROUTINE W REFLEX MICROSCOPIC  HEPATITIS PANEL, ACUTE  I-STAT BETA HCG BLOOD, ED ([facility], [facility], [facility] ONLY)    EKG None  Radiology US Abdomen Limited RUQ (LIVER/GB)  Result Date: 02/03/2020 CLINICAL DATA:  RIGHT upper quadrant/epigastric pain EXAM: ULTRASOUND ABDOMEN LIMITED RIGHT UPPER QUADRANT COMPARISON:  August 08, 2013 FINDINGS: Gallbladder: There are multiple gallstones. There is gallbladder wall thickening. The gallbladder is distended. No definitive pericholecystic fluid identified. Sonographic Eulah Pont sign is negative. Common bile duct: Diameter: 7 mm, the upper limits of normal Liver: Diffusely increased hepatic echogenicity. Portal vein is patent on color Doppler imaging with normal direction of blood flow towards the liver. Other: None. IMPRESSION: 1. Cholelithiasis and gallbladder wall thickening within a distended gallbladder. Although sonographic Eulah Pont sign was negative, findings are concerning for acute cholecystitis. 2. Hepatic steatosis. Electronically Signed   By: Meda Klinefelter MD   On: 02/03/2020 14:59    Procedures Procedures (including critical care time)  Medications Ordered in ED Medications  ondansetron [facility]) injection 4 mg (4 mg Intravenous Given 02/03/20 1407)  lactated ringers bolus 1,000 mL (1,000 mLs Intravenous New Bag/Given 02/03/20 1412)  morphine 4 MG/ML injection 4 mg (4 mg Intravenous Given 02/03/20 1408)    ED Course  I have reviewed the triage vital signs and the nursing notes.  Pertinent labs & imaging results that were available during my care of the patient were reviewed by me and considered in my medical decision making (see chart for details).    MDM Rules/Calculators/A&P                          Patient had worsening abdominal pain nausea vomiting for the last 4 days.  Reports subjective fevers chills, exam concerning with right upper quadrant tenderness will get ultrasound, screening laboratory studies show an  elevated bilirubin elevated ALT AST.  Lipase normal.  No clinical signs of jaundice on exam no altered mental status normal vital signs.  No Tylenol taken at home so less likely concern for toxicity, hepatitis panel sent as well.  No surgical history medical history of prediabetes no medications  Ultrasound imaging reviewed shows concerns for acute cholecystitis General surgery was consulted the patient is aware.  Pt care was handed off to on coming provider at 1530.  Complete history and physical and current plan have been communicated.  Please refer to their note for the remainder of ED care and ultimate disposition.  Pt seen in conjunction with Dr. Effie Shy   Final Clinical Impression(s) / ED Diagnoses Final diagnoses:  Transaminitis  Cholecystitis  Rx / DC Orders ED Discharge Orders    None       Sabino Donovan, MD 02/03/20 1517

## 2020-02-03 NOTE — ED Triage Notes (Signed)
Abdominal pain, n/v since Thursday. Seen at walk in clinic and was prescribed an anti emetic with relief. Attempted BRAT diet and patient is still concerned about possible dehydration. This morning presents with more abdominal pain and bloating.

## 2020-02-04 ENCOUNTER — Inpatient Hospital Stay (HOSPITAL_COMMUNITY): Payer: BC Managed Care – PPO | Admitting: Anesthesiology

## 2020-02-04 ENCOUNTER — Inpatient Hospital Stay (HOSPITAL_COMMUNITY): Payer: BC Managed Care – PPO

## 2020-02-04 ENCOUNTER — Encounter (HOSPITAL_COMMUNITY): Payer: Self-pay

## 2020-02-04 ENCOUNTER — Encounter (HOSPITAL_COMMUNITY): Admission: EM | Disposition: A | Discharge status: Home / Self Care | Payer: Self-pay | Source: Home / Self Care

## 2020-02-04 HISTORY — PX: CHOLECYSTECTOMY: SHX55

## 2020-02-04 LAB — COMPREHENSIVE METABOLIC PANEL
ALT: 491 U/L — ABNORMAL HIGH (ref 0–44)
AST: 216 U/L — ABNORMAL HIGH (ref 15–41)
Albumin: 3 g/dL — ABNORMAL LOW (ref 3.5–5.0)
Alkaline Phosphatase: 169 U/L — ABNORMAL HIGH (ref 38–126)
Anion gap: 10 (ref 5–15)
BUN: 16 mg/dL (ref 6–20)
CO2: 26 mmol/L (ref 22–32)
Calcium: 8.6 mg/dL — ABNORMAL LOW (ref 8.9–10.3)
Chloride: 103 mmol/L (ref 98–111)
Creatinine, Ser: 0.71 mg/dL (ref 0.44–1.00)
GFR, Estimated: >60 mL/min (ref >60)
Glucose, Bld: 111 mg/dL — ABNORMAL HIGH (ref 70–99)
Potassium: 3.3 mmol/L — ABNORMAL LOW (ref 3.5–5.1)
Sodium: 139 mmol/L (ref 135–145)
Total Bilirubin: 1.1 mg/dL (ref 0.3–1.2)
Total Protein: 6.1 g/dL — ABNORMAL LOW (ref 6.5–8.1)

## 2020-02-04 LAB — HIV ANTIBODY (ROUTINE TESTING W REFLEX): HIV Screen 4th Generation wRfx: NONREACTIVE

## 2020-02-04 LAB — CBC
HCT: 42.5 % (ref 36.0–46.0)
Hemoglobin: 13.7 g/dL (ref 12.0–15.0)
MCH: 28.2 pg (ref 26.0–34.0)
MCHC: 32.2 g/dL (ref 30.0–36.0)
MCV: 87.6 fL (ref 80.0–100.0)
Platelets: 314 10*3/uL (ref 150–400)
RBC: 4.85 MIL/uL (ref 3.87–5.11)
RDW: 13.7 % (ref 11.5–15.5)
WBC: 9.7 10*3/uL (ref 4.0–10.5)
nRBC: 0 % (ref 0.0–0.2)

## 2020-02-04 SURGERY — LAPAROSCOPIC CHOLECYSTECTOMY WITH INTRAOPERATIVE CHOLANGIOGRAM
Anesthesia: General | Site: Abdomen

## 2020-02-04 MED ORDER — BUPIVACAINE-EPINEPHRINE (PF) 0.5% -1:200000 IJ SOLN
INTRAMUSCULAR | Status: AC
  Filled 2020-02-04: qty 30

## 2020-02-04 MED ORDER — LACTATED RINGERS IV SOLN: INTRAVENOUS | Status: DC | PRN

## 2020-02-04 MED ORDER — SODIUM CHLORIDE (PF) 0.9 % IJ SOLN
INTRAMUSCULAR | Status: AC
  Filled 2020-02-04: qty 30

## 2020-02-04 MED ORDER — SUCCINYLCHOLINE CHLORIDE 200 MG/10ML IV SOSY
PREFILLED_SYRINGE | INTRAVENOUS | Status: DC | PRN
  Administered 2020-02-04: 120 mg via INTRAVENOUS

## 2020-02-04 MED ORDER — DEXAMETHASONE SODIUM PHOSPHATE 10 MG/ML IJ SOLN
INTRAMUSCULAR | Status: DC | PRN
  Administered 2020-02-04: 10 mg via INTRAVENOUS

## 2020-02-04 MED ORDER — BUPIVACAINE-EPINEPHRINE 0.5% -1:200000 IJ SOLN
INTRAMUSCULAR | Status: DC | PRN
  Administered 2020-02-04: 20 mL

## 2020-02-04 MED ORDER — FENTANYL CITRATE (PF) 250 MCG/5ML IJ SOLN
INTRAMUSCULAR | Status: AC
  Filled 2020-02-04: qty 5

## 2020-02-04 MED ORDER — MIDAZOLAM HCL 2 MG/2ML IJ SOLN
INTRAMUSCULAR | Status: AC
  Filled 2020-02-04: qty 2

## 2020-02-04 MED ORDER — SODIUM CHLORIDE 0.9 % IV SOLN: INTRAVENOUS | Status: DC

## 2020-02-04 MED ORDER — PROMETHAZINE HCL 25 MG/ML IJ SOLN
INTRAMUSCULAR | Status: DC | PRN
  Administered 2020-02-04: 12.5 mg via INTRAVENOUS

## 2020-02-04 MED ORDER — ONDANSETRON HCL 4 MG/2ML IJ SOLN
INTRAMUSCULAR | Status: DC | PRN
  Administered 2020-02-04: 4 mg via INTRAVENOUS

## 2020-02-04 MED ORDER — PROMETHAZINE HCL 25 MG/ML IJ SOLN: 6.2500 mg | INTRAMUSCULAR | Status: DC | PRN

## 2020-02-04 MED ORDER — CEFAZOLIN SODIUM-DEXTROSE 2-4 GM/100ML-% IV SOLN
INTRAVENOUS | Status: AC
  Filled 2020-02-04: qty 100

## 2020-02-04 MED ORDER — PROMETHAZINE HCL 25 MG/ML IJ SOLN
INTRAMUSCULAR | Status: AC
  Filled 2020-02-04: qty 1

## 2020-02-04 MED ORDER — ACETAMINOPHEN 500 MG PO TABS
1000.0000 mg | ORAL_TABLET | Freq: Three times a day (TID) | ORAL | Status: DC
  Administered 2020-02-05 – 2020-02-06 (×3): 1000 mg via ORAL
  Filled 2020-02-04 (×3): qty 2

## 2020-02-04 MED ORDER — HYDROMORPHONE HCL 1 MG/ML IJ SOLN
0.2500 mg | INTRAMUSCULAR | Status: DC | PRN
  Administered 2020-02-04: 0.25 mg via INTRAVENOUS

## 2020-02-04 MED ORDER — IOHEXOL 300 MG/ML  SOLN
INTRAMUSCULAR | Status: DC | PRN
  Administered 2020-02-04: 10 mL

## 2020-02-04 MED ORDER — LACTATED RINGERS IR SOLN
Status: DC | PRN
  Administered 2020-02-04: 1000 mL

## 2020-02-04 MED ORDER — METHOCARBAMOL 1000 MG/10ML IJ SOLN
500.0000 mg | Freq: Three times a day (TID) | INTRAVENOUS | Status: DC
  Administered 2020-02-04 – 2020-02-06 (×4): 500 mg via INTRAVENOUS
  Filled 2020-02-04 (×3): qty 500
  Filled 2020-02-04: qty 5
  Filled 2020-02-04: qty 500

## 2020-02-04 MED ORDER — ONDANSETRON HCL 4 MG/2ML IJ SOLN
INTRAMUSCULAR | Status: AC
  Filled 2020-02-04: qty 2

## 2020-02-04 MED ORDER — HYDROMORPHONE HCL 1 MG/ML IJ SOLN
0.5000 mg | INTRAMUSCULAR | Status: DC | PRN
  Administered 2020-02-04 – 2020-02-05 (×3): 1 mg via INTRAVENOUS
  Filled 2020-02-04 (×3): qty 1

## 2020-02-04 MED ORDER — CHLORHEXIDINE GLUCONATE CLOTH 2 % EX PADS: 6.0000 | MEDICATED_PAD | Freq: Once | CUTANEOUS | Status: DC

## 2020-02-04 MED ORDER — CHLORHEXIDINE GLUCONATE CLOTH 2 % EX PADS
6.0000 | MEDICATED_PAD | Freq: Once | CUTANEOUS | Status: AC
  Administered 2020-02-04: 6 via TOPICAL

## 2020-02-04 MED ORDER — PROPOFOL 10 MG/ML IV BOLUS
INTRAVENOUS | Status: DC | PRN
  Administered 2020-02-04: 150 mg via INTRAVENOUS

## 2020-02-04 MED ORDER — ROCURONIUM BROMIDE 10 MG/ML (PF) SYRINGE
PREFILLED_SYRINGE | INTRAVENOUS | Status: AC
  Filled 2020-02-04: qty 10

## 2020-02-04 MED ORDER — LIDOCAINE 2% (20 MG/ML) 5 ML SYRINGE
INTRAMUSCULAR | Status: DC | PRN
  Administered 2020-02-04: 100 mg via INTRAVENOUS

## 2020-02-04 MED ORDER — MEPERIDINE HCL 50 MG/ML IJ SOLN
6.2500 mg | INTRAMUSCULAR | Status: DC | PRN
Start: 2020-02-04 — End: 2020-02-04

## 2020-02-04 MED ORDER — ROCURONIUM BROMIDE 10 MG/ML (PF) SYRINGE
PREFILLED_SYRINGE | INTRAVENOUS | Status: DC | PRN
  Administered 2020-02-04: 40 mg via INTRAVENOUS

## 2020-02-04 MED ORDER — HYDROMORPHONE HCL 1 MG/ML IJ SOLN
INTRAMUSCULAR | Status: AC
  Filled 2020-02-04: qty 1

## 2020-02-04 MED ORDER — DEXAMETHASONE SODIUM PHOSPHATE 10 MG/ML IJ SOLN
INTRAMUSCULAR | Status: AC
  Filled 2020-02-04: qty 1

## 2020-02-04 MED ORDER — SUGAMMADEX SODIUM 200 MG/2ML IV SOLN
INTRAVENOUS | Status: DC | PRN
  Administered 2020-02-04: 170 mg via INTRAVENOUS

## 2020-02-04 MED ORDER — LIDOCAINE HCL (PF) 2 % IJ SOLN
INTRAMUSCULAR | Status: AC
  Filled 2020-02-04: qty 5

## 2020-02-04 MED ORDER — PROPOFOL 10 MG/ML IV BOLUS
INTRAVENOUS | Status: AC
  Filled 2020-02-04: qty 20

## 2020-02-04 MED ORDER — CEFAZOLIN SODIUM-DEXTROSE 2-4 GM/100ML-% IV SOLN
2.0000 g | INTRAVENOUS | Status: AC
  Administered 2020-02-04: 2 g via INTRAVENOUS

## 2020-02-04 MED ORDER — FENTANYL CITRATE (PF) 250 MCG/5ML IJ SOLN
INTRAMUSCULAR | Status: DC | PRN
  Administered 2020-02-04 (×2): 25 ug via INTRAVENOUS
  Administered 2020-02-04 (×4): 50 ug via INTRAVENOUS

## 2020-02-04 SURGICAL SUPPLY — 37 items
ADH SKN CLS APL DERMABOND .7 (GAUZE/BANDAGES/DRESSINGS) ×1
APL PRP STRL LF DISP 70% ISPRP (MISCELLANEOUS) ×1
APPLIER CLIP ROT 10 11.4 M/L (STAPLE) ×2
APR CLP MED LRG 11.4X10 (STAPLE) ×1
BAG SPEC RTRVL LRG 6X4 10 (ENDOMECHANICALS) ×1
CABLE HIGH FREQUENCY MONO STRZ (ELECTRODE) ×2 IMPLANT
CHLORAPREP W/TINT 26 (MISCELLANEOUS) ×3 IMPLANT
CLIP APPLIE ROT 10 11.4 M/L (STAPLE) ×1 IMPLANT
COVER MAYO STAND STRL (DRAPES) ×2 IMPLANT
COVER SURGICAL LIGHT HANDLE (MISCELLANEOUS) ×2 IMPLANT
COVER WAND RF STERILE (DRAPES) IMPLANT
DECANTER SPIKE VIAL GLASS SM (MISCELLANEOUS) ×2 IMPLANT
DERMABOND ADVANCED (GAUZE/BANDAGES/DRESSINGS) ×1
DERMABOND ADVANCED .7 DNX12 (GAUZE/BANDAGES/DRESSINGS) IMPLANT
DRAPE C-ARM 42X120 X-RAY (DRAPES) ×2 IMPLANT
ELECT REM PT RETURN 15FT ADLT (MISCELLANEOUS) ×2 IMPLANT
GAUZE SPONGE 2X2 8PLY STRL LF (GAUZE/BANDAGES/DRESSINGS) ×1 IMPLANT
GLOVE SURG ORTHO 8.0 STRL STRW (GLOVE) ×2 IMPLANT
GOWN STRL REUS W/TWL XL LVL3 (GOWN DISPOSABLE) ×4 IMPLANT
HEMOSTAT SURGICEL 4X8 (HEMOSTASIS) IMPLANT
KIT TURNOVER KIT A (KITS) ×1 IMPLANT
PENCIL SMOKE EVACUATOR (MISCELLANEOUS) IMPLANT
POUCH SPECIMEN RETRIEVAL 10MM (ENDOMECHANICALS) ×2 IMPLANT
SCISSORS LAP 5X35 DISP (ENDOMECHANICALS) ×2 IMPLANT
SET CHOLANGIOGRAPH MIX (MISCELLANEOUS) ×2 IMPLANT
SET IRRIG TUBING LAPAROSCOPIC (IRRIGATION / IRRIGATOR) ×2 IMPLANT
SET TUBE SMOKE EVAC HIGH FLOW (TUBING) IMPLANT
SLEEVE XCEL OPT CAN 5 100 (ENDOMECHANICALS) ×2 IMPLANT
SPONGE GAUZE 2X2 STER 10/PKG (GAUZE/BANDAGES/DRESSINGS) ×1
STRIP CLOSURE SKIN 1/2X4 (GAUZE/BANDAGES/DRESSINGS) IMPLANT
SUT MNCRL AB 4-0 PS2 18 (SUTURE) ×3 IMPLANT
TOWEL OR 17X26 10 PK STRL BLUE (TOWEL DISPOSABLE) ×2 IMPLANT
TOWEL OR NON WOVEN STRL DISP B (DISPOSABLE) ×2 IMPLANT
TRAY LAPAROSCOPIC (CUSTOM PROCEDURE TRAY) ×2 IMPLANT
TROCAR BLADELESS OPT 5 100 (ENDOMECHANICALS) ×2 IMPLANT
TROCAR XCEL BLUNT TIP 100MML (ENDOMECHANICALS) ×2 IMPLANT
TROCAR XCEL NON-BLD 11X100MML (ENDOMECHANICALS) ×2 IMPLANT

## 2020-02-04 NOTE — Anesthesia Postprocedure Evaluation (Signed)
Anesthesia Post Note  Patient: Katie Keller  Procedure(s) Performed: LAPAROSCOPIC CHOLECYSTECTOMY WITH INTRAOPERATIVE CHOLANGIOGRAM (N/A Abdomen)     Patient location during evaluation: PACU Anesthesia Type: General Level of consciousness: sedated and patient cooperative Pain management: pain level controlled Vital Signs Assessment: post-procedure vital signs reviewed and stable Respiratory status: spontaneous breathing Cardiovascular status: stable Anesthetic complications: no   No complications documented.  Last Vitals:  Vitals:   02/04/20 1700 02/04/20 1715  BP: (!) 145/77   Pulse: (!) 56 60  Resp: (!) 22 16  Temp: 36.8 C 36.7 C  SpO2: 93% 94%    Last Pain:  Vitals:   02/04/20 1715  TempSrc: Oral  PainSc:                  Lewie Loron

## 2020-02-04 NOTE — Op Note (Signed)
Procedure Note  Pre-operative Diagnosis:  Cholecystitis, cholelithiasis  Post-operative Diagnosis:  Cholecystitis, cholelithiasis, choledocholithiasis  Surgeon:  Darnell Level, MD  Assistant:  Will Marlyne Beards, PA-C   Procedure:  Laparoscopic cholecystectomy with intra-operative cholangiography  Anesthesia:  General  Estimated Blood Loss:  minimal  Drains: none         Specimen: gallbladder to pathology  Indications:  Patient admitted from ER with symptomatic cholecystitis.  USN shows multiple gallstones with thick walled gallbladder.  LFT's elevated but improved on the day of surgery.  Now for cholecystectomy with cholangiography.  Procedure Details:  The patient was seen in the pre-op holding area. The risks, benefits, complications, treatment options, and expected outcomes were previously discussed with the patient. The patient agreed with the proposed plan and has signed the informed consent form.  The patient was transported to operating room #2 at the [facility]. The patient was placed in the supine position on the operating room table. Following induction of general anesthesia, the abdomen was prepped and draped in the usual aseptic fashion.  An incision was made in the skin near the umbilicus. The midline fascia was incised and the peritoneal cavity was entered and a Hasson cannula was introduced under direct vision. The cannula was secured with a 0-Vicryl pursestring suture. Pneumoperitoneum was established with carbon dioxide. Additional cannulae were introduced under direct vision along the right costal margin in the midline, mid-clavicular line, and anterior axillary line.   The gallbladder was identified, aspirated with a trocar, and the fundus grasped and retracted cephalad. Adhesions were taken down bluntly and the electrocautery was utilized as needed, taking care not to involve any adjacent structures. The infundibulum was grasped and retracted laterally, exposing  the peritoneum overlying the triangle of Calot. The peritoneum was incised and structures exposed with blunt dissection. The cystic duct was clearly identified, bluntly dissected circumferentially, and clipped at the neck of the gallbladder.  An incision was made in the cystic duct and the cholangiogram catheter introduced. The catheter was secured using an ligaclip.  Real-time cholangiography was performed using C-arm fluoroscopy.  There was rapid filling of a mildly dilated common bile duct.  There was reflux of contrast into the left and right hepatic ductal systems.  There was flow distally demonstrating filling defects in the distal common bile duct.  The catheter was removed from the peritoneal cavity.  The cystic duct was then ligated with ligaclips and divided. The cystic artery was identified, dissected circumferentially, ligated with ligaclips, and divided.  The gallbladder was dissected away from the gallbladder bed using the electrocautery for hemostasis. The gallbladder was completely removed from the liver and placed into an endocatch bag. The gallbladder was removed in the endocatch bag through the umbilical port site and submitted to pathology for review.  The right upper quadrant was irrigated and the gallbladder bed was inspected. Hemostasis was achieved with the electrocautery.  Cannulae were removed under direct vision and good hemostasis was noted. Pneumoperitoneum was released and the majority of the carbon dioxide evacuated. The umbilical wound was irrigated and the fascia was then closed with the pursestring suture.  Local anesthetic was infiltrated at all port sites. Skin incisions were closed with 4-0 Monocril subcuticular sutures and Dermabond was applied.  Instrument, sponge, and needle counts were correct at the conclusion of the case.  The patient was awakened from anesthesia and brought to the recovery room in stable condition.  The patient tolerated the procedure  well.   Darnell Level, MD [facility] Office: [phone]

## 2020-02-04 NOTE — Plan of Care (Signed)
°  Problem: Education: °Goal: Knowledge of General Education information will improve °Description: Including pain rating scale, medication(s)/side effects and non-pharmacologic comfort measures °Outcome: Progressing °  °Problem: Clinical Measurements: °Goal: Diagnostic test results will improve °Outcome: Progressing °  °Problem: Clinical Measurements: °Goal: Ability to maintain clinical measurements within normal limits will improve °Outcome: Progressing °  °

## 2020-02-04 NOTE — Discharge Instructions (Signed)
[facility] ______[facility] °LAPAROSCOPIC SURGERY: POST OP INSTRUCTIONS °Always review your discharge instruction sheet given to you by the facility where your surgery was performed. °IF YOU HAVE DISABILITY OR FAMILY LEAVE FORMS, YOU MUST BRING THEM TO THE OFFICE FOR PROCESSING.   °DO NOT GIVE THEM TO YOUR DOCTOR. ° °1. A prescription for pain medication may be given to you upon discharge.  Take your pain medication as prescribed, if needed.  If narcotic pain medicine is not needed, then you may take acetaminophen (Tylenol) or ibuprofen (Advil) as needed. °2. Take your usually prescribed medications unless otherwise directed. °3. If you need a refill on your pain medication, please contact your pharmacy.  They will contact our office to request authorization. Prescriptions will not be filled after 5pm or on week-ends. °4. You should follow a light diet the first few days after arrival home, such as soup and crackers, etc.  Be sure to include lots of fluids daily. °5. Most patients will experience some swelling and bruising in the area of the incisions.  Ice packs will help.  Swelling and bruising can take several days to resolve.  °6. It is common to experience some constipation if taking pain medication after surgery.  Increasing fluid intake and taking a stool softener (such as Colace) will usually help or prevent this problem from occurring.  A mild laxative (Milk of Magnesia or Miralax) should be taken according to package instructions if there are no bowel movements after 48 hours. °7. Unless discharge instructions indicate otherwise, you may remove your bandages 24-48 hours after surgery, and you may shower at that time.  You may have steri-strips (small skin tapes) in place directly over the incision.  These strips should be left on the skin for 7-10 days.  If your surgeon used skin glue on the incision, you may shower in 24 hours.  The glue will flake off over the next 2-3 weeks.  Any sutures or  staples will be removed at the office during your follow-up visit. °8. ACTIVITIES:  You may resume regular (light) daily activities beginning the next day--such as daily self-care, walking, climbing stairs--gradually increasing activities as tolerated.  You may have sexual intercourse when it is comfortable.  Refrain from any heavy lifting or straining until approved by your doctor. °a. You may drive when you are no longer taking prescription pain medication, you can comfortably wear a seatbelt, and you can safely maneuver your car and apply brakes. °b. RETURN TO WORK:  __________________________________________________________ °9. You should see your doctor in the office for a follow-up appointment approximately 2-3 weeks after your surgery.  Make sure that you call for this appointment within a day or two after you arrive home to insure a convenient appointment time. °10. OTHER INSTRUCTIONS: __________________________________________________________________________________________________________________________ __________________________________________________________________________________________________________________________ °WHEN TO CALL YOUR DOCTOR: °1. Fever over 101.0 °2. Inability to urinate °3. Continued bleeding from incision. °4. Increased pain, redness, or drainage from the incision. °5. Increasing abdominal pain ° °The clinic staff is available to answer your questions during regular business hours.  Please don’t hesitate to call and ask to speak to one of the nurses for clinical concerns.  If you have a medical emergency, go to the nearest emergency room or call 911.  A surgeon from [facility] is always on call at the hospital. °[address], [address], [place], [place]  [address] ? [address] [address], [place], [place]   [address] °[phone] ? [phone] ? FAX [phone] °Web site:   [email or web address] °

## 2020-02-04 NOTE — Anesthesia Preprocedure Evaluation (Addendum)
Anesthesia Evaluation  Patient identified by MRN, date of birth, ID band Patient awake    Reviewed: Allergy & Precautions, NPO status , Patient's Chart, lab work & pertinent test results  Airway Mallampati: III  TM Distance: >3 FB Neck ROM: Full    Dental  (+) Dental Advisory Given, Teeth Intact   Pulmonary neg pulmonary ROS,    Pulmonary exam normal breath sounds clear to auscultation       Cardiovascular negative cardio ROS Normal cardiovascular exam Rhythm:Regular Rate:Normal     Neuro/Psych negative neurological ROS     GI/Hepatic negative GI ROS, Neg liver ROS,   Endo/Other  negative endocrine ROS  Renal/GU negative Renal ROS     Musculoskeletal negative musculoskeletal ROS (+)   Abdominal (+) + obese,   Peds  Hematology negative hematology ROS (+)   Anesthesia Other Findings   Reproductive/Obstetrics                            Anesthesia Physical Anesthesia Plan  ASA: II  Anesthesia Plan: General   Post-op Pain Management:    Induction: Intravenous, Rapid sequence and Cricoid pressure planned  PONV Risk Score and Plan: 4 or greater and Ondansetron, Dexamethasone, Treatment may vary due to age or medical condition and Midazolam  Airway Management Planned: Oral ETT  Additional Equipment: None  Intra-op Plan:   Post-operative Plan: Extubation in OR  Informed Consent: I have reviewed the patients History and Physical, chart, labs and discussed the procedure including the risks, benefits and alternatives for the proposed anesthesia with the patient or authorized representative who has indicated his/her understanding and acceptance.     Dental advisory given  Plan Discussed with: CRNA  Anesthesia Plan Comments:        Anesthesia Quick Evaluation

## 2020-02-04 NOTE — Transfer of Care (Signed)
Immediate Anesthesia Transfer of Care Note  Patient: Katie Keller  Procedure(s) Performed: LAPAROSCOPIC CHOLECYSTECTOMY WITH INTRAOPERATIVE CHOLANGIOGRAM (N/A Abdomen)  Patient Location: PACU  Anesthesia Type:General  Level of Consciousness: drowsy and patient cooperative  Airway & Oxygen Therapy: Patient Spontanous Breathing and Patient connected to face mask oxygen  Post-op Assessment: Report given to RN and Post -op Vital signs reviewed and stable  Post vital signs: Reviewed and stable  Last Vitals:  Vitals Value Taken Time  BP 118/73 02/04/20 1624  Temp 36.7 C 02/04/20 1623  Pulse 63 02/04/20 1626  Resp 19 02/04/20 1626  SpO2 97 % 02/04/20 1626  Vitals shown include unvalidated device data.  Last Pain:  Vitals:   02/04/20 1425  TempSrc: Oral  PainSc:       Patients Stated Pain Goal: 3 (02/04/20 1228)  Complications: No complications documented.

## 2020-02-04 NOTE — Progress Notes (Signed)
Handbook given to patient.  

## 2020-02-04 NOTE — Progress Notes (Signed)
Assessment & Plan: Abdominal pain, cholecystitis, cholelithiasis  Patient with persistent nausea, emesis, and RUQ abdominal pain  LFT's improved but not yet normal  Will plan lap chole with IOC today  The risks and benefits of the procedure have been discussed at length with the patient.  The patient understands the proposed procedure, potential alternative treatments, and the course of recovery to be expected.  All of the patient's questions have been answered at this time.  The patient wishes to proceed with surgery.        Darnell Level, MD       [facility]       Office: [phone]   Chief Complaint: Abdominal pain, nausea emesis  Subjective: Patient seated on side of bed, having emesis in basin.  Complains of RUQ pain.  Discussed with nurse.  Objective: Vital signs in last 24 hours: Temp:  [98 F (36.7 C)-98.6 F (37 C)] 98.6 F (37 C) (12/20 1018) Pulse Rate:  [57-78] 57 (12/20 1018) Resp:  [10-20] 18 (12/20 1018) BP: (103-128)/(60-94) 128/78 (12/20 1018) SpO2:  [90 %-95 %] 95 % (12/20 1018) Weight:  [83.4 kg] 83.4 kg (12/19 2044) Last BM Date: 01/29/20  Intake/Output from previous day: 12/19 0701 - 12/20 0700 In: 1853.1 [I.V.:753.1; IV Piggyback:1100] Out: -  Intake/Output this shift: Total I/O In: 186.5 [I.V.:186.5] Out: -   Physical Exam: HEENT - sclerae clear, mucous membranes moist Ext - no edema, non-tender Neuro - alert & oriented, no focal deficits  Lab Results:  Recent Labs    02/03/20 1023 02/04/20 0244  WBC 11.3* 9.7  HGB 16.9* 13.7  HCT 51.8* 42.5  PLT 378 314   BMET Recent Labs    02/03/20 1023 02/04/20 0244  NA 136 139  K 3.9 3.3*  CL 97* 103  CO2 27 26  GLUCOSE 155* 111*  BUN 17 16  CREATININE 1.02* 0.71  CALCIUM 9.7 8.6*   PT/INR No results for input(s): LABPROT, INR in the last 72 hours. Comprehensive Metabolic Panel:    Component Value Date/Time   NA 139 02/04/2020 0244   NA 136 02/03/2020  1023   K 3.3 (L) 02/04/2020 0244   K 3.9 02/03/2020 1023   CL 103 02/04/2020 0244   CL 97 (L) 02/03/2020 1023   CO2 26 02/04/2020 0244   CO2 27 02/03/2020 1023   BUN 16 02/04/2020 0244   BUN 17 02/03/2020 1023   CREATININE 0.71 02/04/2020 0244   CREATININE 1.02 (H) 02/03/2020 1023   GLUCOSE 111 (H) 02/04/2020 0244   GLUCOSE 155 (H) 02/03/2020 1023   CALCIUM 8.6 (L) 02/04/2020 0244   CALCIUM 9.7 02/03/2020 1023   AST 216 (H) 02/04/2020 0244   AST 418 (H) 02/03/2020 1023   ALT 491 (H) 02/04/2020 0244   ALT 671 (H) 02/03/2020 1023   ALKPHOS 169 (H) 02/04/2020 0244   ALKPHOS 193 (H) 02/03/2020 1023   BILITOT 1.1 02/04/2020 0244   BILITOT 3.1 (H) 02/03/2020 1023   PROT 6.1 (L) 02/04/2020 0244   PROT 8.0 02/03/2020 1023   ALBUMIN 3.0 (L) 02/04/2020 0244   ALBUMIN 3.9 02/03/2020 1023    Studies/Results: US Abdomen Limited RUQ (LIVER/GB)  Result Date: 02/03/2020 CLINICAL DATA:  RIGHT upper quadrant/epigastric pain EXAM: ULTRASOUND ABDOMEN LIMITED RIGHT UPPER QUADRANT COMPARISON:  August 08, 2013 FINDINGS: Gallbladder: There are multiple gallstones. There is gallbladder wall thickening. The gallbladder is distended. No definitive pericholecystic fluid identified. Sonographic Eulah Pont sign is negative. Common bile duct:  Diameter: 7 mm, the upper limits of normal Liver: Diffusely increased hepatic echogenicity. Portal vein is patent on color Doppler imaging with normal direction of blood flow towards the liver. Other: None. IMPRESSION: 1. Cholelithiasis and gallbladder wall thickening within a distended gallbladder. Although sonographic Eulah Pont sign was negative, findings are concerning for acute cholecystitis. 2. Hepatic steatosis. Electronically Signed   By: Meda Klinefelter MD   On: 02/03/2020 14:59      Darnell Level 02/04/2020  Patient ID: Katie Keller, female   DOB: August 28, 1960, 59 y.o.   MRN: [id number]

## 2020-02-04 NOTE — Anesthesia Procedure Notes (Signed)
Procedure Name: Intubation Date/Time: 02/04/2020 3:02 PM Performed by: Florene Route, CRNA Patient Re-evaluated:Patient Re-evaluated prior to induction Oxygen Delivery Method: Circle system utilized Preoxygenation: Pre-oxygenation with 100% oxygen Induction Type: IV induction, Rapid sequence and Cricoid Pressure applied Laryngoscope Size: Miller Grade View: Grade I Tube type: Oral Tube size: 7.5 mm Number of attempts: 1 Airway Equipment and Method: Stylet Placement Confirmation: ETT inserted through vocal cords under direct vision,  positive ETCO2 and breath sounds checked- equal and bilateral Secured at: 21 cm Tube secured with: Tape Dental Injury: Teeth and Oropharynx as per pre-operative assessment

## 2020-02-05 ENCOUNTER — Inpatient Hospital Stay (HOSPITAL_COMMUNITY): Payer: BC Managed Care – PPO

## 2020-02-05 ENCOUNTER — Encounter (HOSPITAL_COMMUNITY): Admission: EM | Disposition: A | Discharge status: Home / Self Care | Payer: Self-pay | Source: Home / Self Care

## 2020-02-05 ENCOUNTER — Inpatient Hospital Stay (HOSPITAL_COMMUNITY): Payer: BC Managed Care – PPO | Admitting: Registered Nurse

## 2020-02-05 ENCOUNTER — Encounter (HOSPITAL_COMMUNITY): Payer: Self-pay | Admitting: Surgery

## 2020-02-05 ENCOUNTER — Other Ambulatory Visit: Payer: Self-pay

## 2020-02-05 HISTORY — PX: REMOVAL OF STONES: SHX5545

## 2020-02-05 HISTORY — PX: SPHINCTEROTOMY: SHX5544

## 2020-02-05 HISTORY — PX: ERCP: SHX5425

## 2020-02-05 LAB — CBC
HCT: 44.8 % (ref 36.0–46.0)
Hemoglobin: 14.7 g/dL (ref 12.0–15.0)
MCH: 28.4 pg (ref 26.0–34.0)
MCHC: 32.8 g/dL (ref 30.0–36.0)
MCV: 86.7 fL (ref 80.0–100.0)
Platelets: 343 10*3/uL (ref 150–400)
RBC: 5.17 MIL/uL — ABNORMAL HIGH (ref 3.87–5.11)
RDW: 13.7 % (ref 11.5–15.5)
WBC: 11.9 10*3/uL — ABNORMAL HIGH (ref 4.0–10.5)
nRBC: 0 % (ref 0.0–0.2)

## 2020-02-05 SURGERY — ERCP, WITH INTERVENTION IF INDICATED
Anesthesia: General

## 2020-02-05 MED ORDER — GLUCAGON HCL RDNA (DIAGNOSTIC) 1 MG IJ SOLR
INTRAMUSCULAR | Status: DC | PRN
  Administered 2020-02-05: .5 mg via INTRAVENOUS

## 2020-02-05 MED ORDER — INDOMETHACIN 50 MG RE SUPP
RECTAL | Status: DC | PRN
  Administered 2020-02-05: 100 mg via RECTAL

## 2020-02-05 MED ORDER — KCL IN DEXTROSE-NACL 40-5-0.9 MEQ/L-%-% IV SOLN
INTRAVENOUS | Status: DC
  Filled 2020-02-05 (×3): qty 1000

## 2020-02-05 MED ORDER — INDOMETHACIN 50 MG RE SUPP
RECTAL | Status: AC
  Filled 2020-02-05: qty 2

## 2020-02-05 MED ORDER — ONDANSETRON HCL 4 MG/2ML IJ SOLN
INTRAMUSCULAR | Status: DC | PRN
  Administered 2020-02-05: 4 mg via INTRAVENOUS

## 2020-02-05 MED ORDER — PROPOFOL 10 MG/ML IV BOLUS
INTRAVENOUS | Status: DC | PRN
  Administered 2020-02-05: 170 mg via INTRAVENOUS

## 2020-02-05 MED ORDER — PROPOFOL 10 MG/ML IV BOLUS
INTRAVENOUS | Status: AC
  Filled 2020-02-05: qty 20

## 2020-02-05 MED ORDER — LACTATED RINGERS IV SOLN: INTRAVENOUS | Status: DC | PRN

## 2020-02-05 MED ORDER — MIDAZOLAM HCL 2 MG/2ML IJ SOLN
INTRAMUSCULAR | Status: AC
  Filled 2020-02-05: qty 2

## 2020-02-05 MED ORDER — FENTANYL CITRATE (PF) 100 MCG/2ML IJ SOLN
INTRAMUSCULAR | Status: DC | PRN
  Administered 2020-02-05 (×2): 50 ug via INTRAVENOUS

## 2020-02-05 MED ORDER — MENTHOL 3 MG MT LOZG
1.0000 | LOZENGE | OROMUCOSAL | Status: DC | PRN
  Filled 2020-02-05: qty 9

## 2020-02-05 MED ORDER — FENTANYL CITRATE (PF) 100 MCG/2ML IJ SOLN
INTRAMUSCULAR | Status: AC
  Filled 2020-02-05: qty 2

## 2020-02-05 MED ORDER — MIDAZOLAM HCL 5 MG/5ML IJ SOLN
INTRAMUSCULAR | Status: DC | PRN
  Administered 2020-02-05 (×2): 1 mg via INTRAVENOUS

## 2020-02-05 MED ORDER — LIDOCAINE HCL (CARDIAC) PF 100 MG/5ML IV SOSY
PREFILLED_SYRINGE | INTRAVENOUS | Status: DC | PRN
  Administered 2020-02-05: 50 mg via INTRAVENOUS

## 2020-02-05 MED ORDER — PROMETHAZINE HCL 25 MG/ML IJ SOLN
INTRAMUSCULAR | Status: DC | PRN
  Administered 2020-02-05: 9 mg via INTRAVENOUS

## 2020-02-05 MED ORDER — GLUCAGON HCL RDNA (DIAGNOSTIC) 1 MG IJ SOLR
INTRAMUSCULAR | Status: AC
  Filled 2020-02-05: qty 1

## 2020-02-05 MED ORDER — DEXAMETHASONE SODIUM PHOSPHATE 4 MG/ML IJ SOLN
INTRAMUSCULAR | Status: DC | PRN
  Administered 2020-02-05: 10 mg via INTRAVENOUS

## 2020-02-05 MED ORDER — SUCCINYLCHOLINE CHLORIDE 20 MG/ML IJ SOLN
INTRAMUSCULAR | Status: DC | PRN
  Administered 2020-02-05: 120 mg via INTRAVENOUS

## 2020-02-05 MED ORDER — SODIUM CHLORIDE 0.9 % IV SOLN
2.0000 g | INTRAVENOUS | Status: DC
  Administered 2020-02-05: 2 g via INTRAVENOUS
  Filled 2020-02-05: qty 20
  Filled 2020-02-05: qty 2

## 2020-02-05 NOTE — Op Note (Signed)
[facility] Patient Name: Katie Keller Procedure Date: 02/05/2020 MRN: [id number] Attending MD: Willis Modena , MD Date of Birth: 1960-03-12 CSN: [id number] Age: 59 Admit Type: Inpatient Procedure:                ERCP Indications:              Common bile duct stone(s), Filling defect on                            intraoperative cholangiogram, Elevated liver enzymes Providers:                Willis Modena, MD, Charlett Lango, RN, Lawson Radar, Technician, Anastasio Champion, CRNA Referring MD:             [facility] Medicines:                Rocephin 1 g IV, Indomethacin 100 mg PR, Glucagon                            0.5 mg IV, General Anesthesia Complications:            No immediate complications. Estimated Blood Loss:     Estimated blood loss: none. Procedure:                Pre-Anesthesia Assessment:                           - Prior to the procedure, a History and Physical                            was performed, and patient medications and                            allergies were reviewed. The patient's tolerance of                            previous anesthesia was also reviewed. The risks                            and benefits of the procedure and the sedation                            options and risks were discussed with the patient.                            All questions were answered, and informed consent                            was obtained. Prior Anticoagulants: The patient has                            taken no previous anticoagulant or antiplatelet  agents. ASA Grade Assessment: II - A patient with                            mild systemic disease. After reviewing the risks                            and benefits, the patient was deemed in                            satisfactory condition to undergo the procedure.                           After obtaining informed consent, the  scope was                            passed under direct vision. Throughout the                            procedure, the patient's blood pressure, pulse, and                            oxygen saturations were monitored continuously. The                            [id number]-Q180V ([id number]) Olympus Duodenoscope was                            introduced through the mouth, and used to inject                            contrast into and used to cannulate the bile duct.                            The ERCP was accomplished without difficulty. The                            patient tolerated the procedure well. Scope In: Scope Out: Findings:      A scout film of the abdomen was obtained. Surgical clips, consistent       with a previous cholecystectomy, were seen in the area of the right       upper quadrant of the abdomen. The esophagus was successfully intubated       under direct vision. The scope was advanced to a normal major papilla in       the descending duodenum without detailed examination of the pharynx,       larynx and associated structures, and upper GI tract. The upper GI tract       was grossly normal. The bile duct was deeply cannulated. Contrast was       injected. I personally interpreted the bile duct images. Ductal flow of       contrast was adequate. Image quality was adequate. Contrast extended to       the bifurcation. Bile duct maximally about 79mm in diameter. No bile leak       from cystic stump seen. The lower  third of the main bile duct contained       one stone. A 6 mm biliary sphincterotomy was made with a traction       (standard) sphincterotome using blended current. There was no       post-sphincterotomy bleeding. The biliary tree was swept with a basket       starting at the upper third of the main bile duct, middle third of the       main bile duct and lower third of the main duct. One stone was removed.       No stones remained upon follow-up basket trawls and  balloon sweeps. Bile       flow through ampulla good post-procedure. Pancreatogram was not obtained       (by intent). Impression:               - Choledocholithiasis was found. Complete removal                            was accomplished by biliary sphincterotomy and                            basket extraction.                           - A biliary sphincterotomy was performed.                           - The biliary tree was swept. Moderate Sedation:      None Recommendation:           - Avoid aspirin and nonsteroidal anti-inflammatory                            medicines for 3 days.                           - Clear liquid diet today.                           - Continue present medications.                           [facility] will follow. Procedure Code(s):        --- Professional ---                           [phone], Endoscopic retrograde                            cholangiopancreatography (ERCP); with removal of                            calculi/debris from biliary/pancreatic duct(s)                           43262, Endoscopic retrograde                            cholangiopancreatography (ERCP); with  sphincterotomy/papillotomy Diagnosis Code(s):        --- Professional ---                           K80.50, Calculus of bile duct without cholangitis                            or cholecystitis without obstruction                           R74.8, Abnormal levels of other serum enzymes                           R93.2, Abnormal findings on diagnostic imaging of                            liver and biliary tract CPT copyright 2019 [employer]. All rights reserved. The codes documented in this report are preliminary and upon coder review may  be revised to meet current compliance requirements. Willis ModenaWilliam Sanders Manninen, MD 02/05/2020 12:17:01 PM This report has been signed electronically. Number of Addenda: 0

## 2020-02-05 NOTE — Anesthesia Preprocedure Evaluation (Signed)
Anesthesia Evaluation  Patient identified by MRN, date of birth, ID band Patient awake    Reviewed: NPO status , Patient's Chart, lab work & pertinent test results  Airway Mallampati: II  TM Distance: >3 FB Neck ROM: Full    Dental  (+) Teeth Intact   Pulmonary neg pulmonary ROS,    Pulmonary exam normal        Cardiovascular negative cardio ROS   Rhythm:Regular Rate:Normal     Neuro/Psych negative neurological ROS  negative psych ROS   GI/Hepatic Neg liver ROS, gallstones   Endo/Other  negative endocrine ROS  Renal/GU   negative genitourinary   Musculoskeletal negative musculoskeletal ROS (+)   Abdominal (+)  Abdomen: soft. Bowel sounds: normal.  Peds  Hematology negative hematology ROS (+)   Anesthesia Other Findings   Reproductive/Obstetrics negative OB ROS                             Anesthesia Physical Anesthesia Plan  ASA: II  Anesthesia Plan: General   Post-op Pain Management:    Induction: Intravenous  PONV Risk Score and Plan: 3 and Ondansetron, Dexamethasone, Midazolam and Treatment may vary due to age or medical condition  Airway Management Planned: Mask and Oral ETT  Additional Equipment: None  Intra-op Plan:   Post-operative Plan: Extubation in OR  Informed Consent: I have reviewed the patients History and Physical, chart, labs and discussed the procedure including the risks, benefits and alternatives for the proposed anesthesia with the patient or authorized representative who has indicated his/her understanding and acceptance.     Dental advisory given  Plan Discussed with: CRNA  Anesthesia Plan Comments: (Lab Results      Component                Value               Date                      WBC                      11.9 (H)            02/05/2020                HGB                      14.7                02/05/2020                HCT                       44.8                02/05/2020                MCV                      86.7                02/05/2020                PLT                      343  02/05/2020           Lab Results      Component                Value               Date                      NA                       139                 02/04/2020                K                        3.3 (L)             02/04/2020                CO2                      26                  02/04/2020                GLUCOSE                  111 (H)             02/04/2020                BUN                      16                  02/04/2020                CREATININE               0.71                02/04/2020                CALCIUM                  8.6 (L)             02/04/2020                GFRNONAA                 >60                 02/04/2020           Lab Results      Component                Value               Date                      ALT                      491 (H)             02/04/2020                AST  216 (H)             02/04/2020                ALKPHOS                  169 (H)             02/04/2020                BILITOT                  1.1                 02/04/2020          )        Anesthesia Quick Evaluation

## 2020-02-05 NOTE — Progress Notes (Signed)
Patient ambulating to bathroom. Surgical sites cleansed with soap and water.

## 2020-02-05 NOTE — H&P (View-Only) (Signed)
Referring Provider: Dr. Gerrit Friends ([facility]) Primary Care Physician:  Blair Heys, MD Primary Gastroenterologist:  Gentry Fitz but has Deboraha Sprang PCP   Reason for Consultation:  Choledocholithiasis, abnormal IOC  HPI: Katie Keller is a 59 y.o. female presenting for consultation of abnormal IOC and suspected choledocholithiasis.  Patient underwent laparoscopic cholecystectomy yesterday 12/20.  She states she started having nausea on 12/16 with vomiting for 30 hours.  She then started having severe abdominal pain on Sunday 12/19, which caused her to present to the ED.  Ultrasound 12/19 revealed cholelithiasis with gallbladder wall thickening and distended gallbladder, concerning for acute cholecystitis.  Patient states she has had 2 prior episodes of nausea and vomiting within the last several months.  Patient had one episode of diarrhea on Thursday 12/16 but otherwise denies changes in bowel habits.  Denies any melena or hematochezia.  Denies dysphagia.  Denies changes in appetite or unexplained weight loss proceeding most recent episode of cholecystitis.  Today, pain is improved compared to pre-operative pain.  Denies nausea/vomiting today.  Takes ibuprofen as needed, though states this is infrequent.  Denies aspirin or blood thinner use.  IOC 12/20: Several small mobile filling defects in the distal common duct. Intrahepatic ducts are incompletely visualized, appearing distended centrally. No significant contrast passes into the duodenum. Obstructing filling defects in the distal CBD suggesting choledocholithiasis.   History reviewed. No pertinent past medical history.  Past Surgical History:  Procedure Laterality Date  . CHOLECYSTECTOMY N/A 02/04/2020   Procedure: LAPAROSCOPIC CHOLECYSTECTOMY WITH INTRAOPERATIVE CHOLANGIOGRAM;  Surgeon: Darnell Level, MD;  Location: [facility];  Service: General;  Laterality: N/A;    Prior to Admission medications   Not on File    Scheduled Meds: .  acetaminophen  1,000 mg Oral Q8H   Continuous Infusions: . sodium chloride    . dextrose 5 % and 0.9 % NaCl with KCl 20 mEq/L 100 mL/hr at 02/05/20 0207  . methocarbamol (ROBAXIN) IV 500 mg (02/04/20 2112)   PRN Meds:.HYDROmorphone (DILAUDID) injection, ondansetron **OR** ondansetron (ZOFRAN) IV, oxyCODONE, zolpidem  Allergies as of 02/03/2020 - Review Complete 02/03/2020  Allergen Reaction Noted  . Pravachol [pravastatin]  02/03/2020    History reviewed. No pertinent family history.  Social History   Socioeconomic History  . Marital status: Married    Spouse name: Not on file  . Number of children: Not on file  . Years of education: Not on file  . Highest education level: Not on file  Occupational History  . Not on file  Tobacco Use  . Smoking status: Never Smoker  . Smokeless tobacco: Never Used  Substance and Sexual Activity  . Alcohol use: Not on file  . Drug use: Not on file  . Sexual activity: Not on file  Other Topics Concern  . Not on file  Social History Narrative  . Not on file   Social Determinants of Health   Financial Resource Strain: Not on file  Food Insecurity: Not on file  Transportation Needs: Not on file  Physical Activity: Not on file  Stress: Not on file  Social Connections: Not on file  Intimate Partner Violence: Not on file    Review of Systems: Review of Systems  Constitutional: Negative for chills, fever and weight loss.  HENT: Negative for hearing loss and tinnitus.   Eyes: Negative for pain and redness.  Respiratory: Negative for cough and shortness of breath.   Cardiovascular: Negative for chest pain and palpitations.  Gastrointestinal: Positive for abdominal pain, nausea and vomiting. Negative  for blood in stool, constipation, diarrhea, heartburn and melena.  Genitourinary: Negative for flank pain and hematuria.  Musculoskeletal: Negative for falls and joint pain.  Skin: Negative for itching and rash.    Physical Exam: Vital  signs: Vitals:   02/05/20 0129 02/05/20 0537  BP: (!) 145/70 101/66  Pulse: (!) 58 61  Resp: 16 16  Temp: 98 F (36.7 C) 97.8 F (36.6 C)  SpO2: 95% 97%   Last BM Date: 01/29/20  Physical Exam Vitals reviewed.  Constitutional:      General: She is not in acute distress. HENT:     Head: Normocephalic and atraumatic.     Nose: Nose normal. No congestion.     Mouth/Throat:     Mouth: Mucous membranes are moist.     Pharynx: Oropharynx is clear.  Eyes:     General: No scleral icterus.    Extraocular Movements: Extraocular movements intact.     Conjunctiva/sclera: Conjunctivae normal.  Cardiovascular:     Rate and Rhythm: Normal rate and regular rhythm.     Pulses: Normal pulses.  Pulmonary:     Effort: Pulmonary effort is normal. No respiratory distress.     Breath sounds: Normal breath sounds.  Abdominal:     General: Bowel sounds are normal. There is no distension.     Palpations: Abdomen is soft. There is no mass.     Tenderness: There is no abdominal tenderness. There is no guarding or rebound.     Hernia: No hernia is present.  Musculoskeletal:        General: No swelling or tenderness.     Cervical back: Normal range of motion and neck supple.  Skin:    General: Skin is warm and dry.     Coloration: Skin is not jaundiced.  Neurological:     General: No focal deficit present.     Mental Status: She is alert and oriented to person, place, and time.  Psychiatric:        Behavior: Behavior normal.     GI:  Lab Results: Recent Labs    02/03/20 1023 02/04/20 0244 02/05/20 0300  WBC 11.3* 9.7 11.9*  HGB 16.9* 13.7 14.7  HCT 51.8* 42.5 44.8  PLT 378 314 343   BMET Recent Labs    02/03/20 1023 02/04/20 0244  NA 136 139  K 3.9 3.3*  CL 97* 103  CO2 27 26  GLUCOSE 155* 111*  BUN 17 16  CREATININE 1.02* 0.71  CALCIUM 9.7 8.6*   LFT Recent Labs    02/04/20 0244  PROT 6.1*  ALBUMIN 3.0*  AST 216*  ALT 491*  ALKPHOS 169*  BILITOT 1.1    PT/INR No results for input(s): LABPROT, INR in the last 72 hours.   Studies/Results: DG Cholangiogram Operative  Result Date: 02/04/2020 CLINICAL DATA:  Abdominal pain EXAM: INTRAOPERATIVE CHOLANGIOGRAM TECHNIQUE: Cholangiographic images from the C-arm fluoroscopic device were submitted for interpretation post-operatively. Please see the procedural report for the amount of contrast and the fluoroscopy time utilized. COMPARISON:  Ultrasound 02/03/2020 FINDINGS: Several small mobile filling defects in the distal common duct. Intrahepatic ducts are incompletely visualized, appearing distended centrally. No significant contrast passes into the duodenum. : Obstructing filling defects in the distal CBD suggesting choledocholithiasis. As requested, critical Value/emergent results were called by telephone at the time of interpretation on 02/04/2020 at 4:06 pm to provider TODD GERKIN in the OR, who verbally acknowledged these results. Electronically Signed   By: Ronald Pippins.D.  On: 02/04/2020 16:07   US Abdomen Limited RUQ (LIVER/GB)  Result Date: 02/03/2020 CLINICAL DATA:  RIGHT upper quadrant/epigastric pain EXAM: ULTRASOUND ABDOMEN LIMITED RIGHT UPPER QUADRANT COMPARISON:  August 08, 2013 FINDINGS: Gallbladder: There are multiple gallstones. There is gallbladder wall thickening. The gallbladder is distended. No definitive pericholecystic fluid identified. Sonographic Eulah Pont sign is negative. Common bile duct: Diameter: 7 mm, the upper limits of normal Liver: Diffusely increased hepatic echogenicity. Portal vein is patent on color Doppler imaging with normal direction of blood flow towards the liver. Other: None. IMPRESSION: 1. Cholelithiasis and gallbladder wall thickening within a distended gallbladder. Although sonographic Eulah Pont sign was negative, findings are concerning for acute cholecystitis. 2. Hepatic steatosis. Electronically Signed   By: Meda Klinefelter MD   On: 02/03/2020 14:59     Impression: Abnormal IOC, suspected choledocholithiasis.  Cholecystitis s/p cholecystectomy 02/04/2020. -LFTs trending down.  Yesterday, T bili 1.1/AST 216/ALT 491/ALP 169 as compared to T bili 3.1/AST 14/ALT 671/ALP 193 on 12/19. -Mild leukocytosis, WBC is 11.9 -IOC 02/04/2020:  Obstructing filling defects in the distal CBD suggesting choledocholithiasis.  Plan: ERCP today with Dr. Dulce Sellar.  We thoroughly discussed the procedure with patient to include nature, alternatives, benefits, and risks (including but not limited to pancreatitis, bleeding, infection, perforation, anesthesia/cardiac and pulmonary medications).  Patient verbalized understanding and gave verbal consent to proceed with ERCP.  Continue to trend LFTs.  [facility] will follow.   LOS: 2 days   Edrick Kins  PA-C 02/05/2020, 8:42 AM  Contact #  [phone]

## 2020-02-05 NOTE — Interval H&P Note (Signed)
History and Physical Interval Note:  02/05/2020 10:55 AM  Shari Prows  has presented today for surgery, with the diagnosis of Gallstones.  The various methods of treatment have been discussed with the patient and family. After consideration of risks, benefits and other options for treatment, the patient has consented to  Procedure(s): ENDOSCOPIC RETROGRADE CHOLANGIOPANCREATOGRAPHY (ERCP) (N/A) as a surgical intervention.  The patient's history has been reviewed, patient examined, no change in status, stable for surgery.  I have reviewed the patient's chart and labs.  Questions were answered to the patient's satisfaction.     Sage Hammill M  Assessment:   Cholecystitis s/p cholecystectomy. Elevated LFTs, downtrending. Abnormal IOC (personally reviewed), worrisome for choledocholithiasis.  Plan:   Endoscopic retrograde cholangiopancreatography with anticipated biliary sphincterotomy and stone extraction. Risks (up to and including bleeding, infection, perforation, pancreatitis that can be complicated by infected necrosis and death), benefits (removal of stones, alleviating blockage, decreasing risk of cholangitis or choledocholithiasis-related pancreatitis), and alternatives (watchful waiting, percutaneous transhepatic cholangiography) of ERCP were explained to patient/family in detail and patient elects to proceed.

## 2020-02-05 NOTE — Anesthesia Postprocedure Evaluation (Signed)
Anesthesia Post Note  Patient: Patriciaann Clan Tout  Procedure(s) Performed: ENDOSCOPIC RETROGRADE CHOLANGIOPANCREATOGRAPHY (ERCP) (N/A ) SPHINCTEROTOMY REMOVAL OF STONES     Patient location during evaluation: Endoscopy Anesthesia Type: General Level of consciousness: awake and alert Pain management: pain level controlled Vital Signs Assessment: post-procedure vital signs reviewed and stable Respiratory status: spontaneous breathing, nonlabored ventilation, respiratory function stable and patient connected to nasal cannula oxygen Cardiovascular status: blood pressure returned to baseline and stable Postop Assessment: no apparent nausea or vomiting Anesthetic complications: no   No complications documented.  Last Vitals:  Vitals:   02/05/20 1240 02/05/20 1250  BP: 138/66 137/70  Pulse: (!) 49 (!) 57  Resp: 12 15  Temp:    SpO2: (!) 89% 90%    Last Pain:  Vitals:   02/05/20 1250  TempSrc:   PainSc: 0-No pain                 Earl Lites P Staysha Truby

## 2020-02-05 NOTE — Anesthesia Procedure Notes (Signed)
Procedure Name: Intubation Date/Time: 02/05/2020 11:27 AM Performed by: Lissa Morales, CRNA Pre-anesthesia Checklist: Patient identified, Emergency Drugs available, Suction available and Patient being monitored Patient Re-evaluated:Patient Re-evaluated prior to induction Oxygen Delivery Method: Circle system utilized Preoxygenation: Pre-oxygenation with 100% oxygen Induction Type: IV induction Ventilation: Mask ventilation without difficulty Laryngoscope Size: Mac and 4 Grade View: Grade II Tube type: Oral Tube size: 7.5 mm Number of attempts: 1 Airway Equipment and Method: Stylet and Oral airway Placement Confirmation: ETT inserted through vocal cords under direct vision,  positive ETCO2 and breath sounds checked- equal and bilateral Secured at: 21 cm Tube secured with: Tape Dental Injury: Teeth and Oropharynx as per pre-operative assessment

## 2020-02-05 NOTE — Progress Notes (Signed)
1 Day Post-Op    CC: Abdominal pain  Subjective: Patient is port sites all look good.  She is feeling much better.  Awaiting transfer to Endo lab for ERCP later this AM.  Objective: Vital signs in last 24 hours: Temp:  [97.8 F (36.6 C)-98.6 F (37 C)] 97.8 F (36.6 C) (12/21 0537) Pulse Rate:  [52-65] 61 (12/21 0537) Resp:  [16-22] 16 (12/21 0537) BP: (101-161)/(66-85) 101/66 (12/21 0537) SpO2:  [93 %-100 %] 97 % (12/21 0537) Last BM Date: 01/29/20 50 p.o. recorded 2100 IV Voided x6 Emesis x1 No BM recorded Afebrile, vital signs are stable WBC 11.9  Intake/Output from previous day: 12/20 0701 - 12/21 0700 In: 2134.5 [P.O.:50; I.V.:1884.5; IV Piggyback:200] Out: 20 [Blood:20] Intake/Output this shift: No intake/output data recorded.  General appearance: alert, cooperative and no distress Resp: clear to auscultation bilaterally GI: Soft, sore, still having a little bit of gas pain.  Port sites all look fine.  Lab Results:  Recent Labs    02/04/20 0244 02/05/20 0300  WBC 9.7 11.9*  HGB 13.7 14.7  HCT 42.5 44.8  PLT 314 343    BMET Recent Labs    02/03/20 1023 02/04/20 0244  NA 136 139  K 3.9 3.3*  CL 97* 103  CO2 27 26  GLUCOSE 155* 111*  BUN 17 16  CREATININE 1.02* 0.71  CALCIUM 9.7 8.6*   PT/INR No results for input(s): LABPROT, INR in the last 72 hours.  Recent Labs  Lab 02/03/20 1023 02/04/20 0244  AST 418* 216*  ALT 671* 491*  ALKPHOS 193* 169*  BILITOT 3.1* 1.1  PROT 8.0 6.1*  ALBUMIN 3.9 3.0*     Lipase     Component Value Date/Time   LIPASE 19 02/03/2020 1023   Prior to Admission medications   Not on File      Medications:  acetaminophen  1,000 mg Oral Q8H    sodium chloride     dextrose 5 % and 0.9 % NaCl with KCl 20 mEq/L 100 mL/hr at 02/05/20 0207   methocarbamol (ROBAXIN) IV 500 mg (02/04/20 2112)    Assessment/Plan   Acute cholecystitis, cholelithiasis, choledocholithiasis Laparoscopic cholecystectomy  with intraoperative cholangiogram 02/04/2020, Dr. Darnell Level  FEN: IV fluids/n.p.o. ID: Rocephin DVT:SCD's/ Lovenox on hold for Gi intervention Follow up:  [facility]  Plan: ERCP later this AM.  Recheck labs, and advance diet post procedure.  If she does well able to discharge her home tomorrow.    LOS: 2 days    Katie Keller 02/05/2020 Please see Amion

## 2020-02-05 NOTE — Consult Note (Signed)
Referring Provider: Dr. Gerrit Friends ([facility]) Primary Care Physician:  Blair Heys, MD Primary Gastroenterologist:  Gentry Fitz but has Deboraha Sprang PCP   Reason for Consultation:  Choledocholithiasis, abnormal IOC  HPI: Katie Keller is a 59 y.o. female presenting for consultation of abnormal IOC and suspected choledocholithiasis.  Patient underwent laparoscopic cholecystectomy yesterday 12/20.  She states she started having nausea on 12/16 with vomiting for 30 hours.  She then started having severe abdominal pain on Sunday 12/19, which caused her to present to the ED.  Ultrasound 12/19 revealed cholelithiasis with gallbladder wall thickening and distended gallbladder, concerning for acute cholecystitis.  Patient states she has had 2 prior episodes of nausea and vomiting within the last several months.  Patient had one episode of diarrhea on Thursday 12/16 but otherwise denies changes in bowel habits.  Denies any melena or hematochezia.  Denies dysphagia.  Denies changes in appetite or unexplained weight loss proceeding most recent episode of cholecystitis.  Today, pain is improved compared to pre-operative pain.  Denies nausea/vomiting today.  Takes ibuprofen as needed, though states this is infrequent.  Denies aspirin or blood thinner use.  IOC 12/20: Several small mobile filling defects in the distal common duct. Intrahepatic ducts are incompletely visualized, appearing distended centrally. No significant contrast passes into the duodenum. Obstructing filling defects in the distal CBD suggesting choledocholithiasis.   History reviewed. No pertinent past medical history.  Past Surgical History:  Procedure Laterality Date  . CHOLECYSTECTOMY N/A 02/04/2020   Procedure: LAPAROSCOPIC CHOLECYSTECTOMY WITH INTRAOPERATIVE CHOLANGIOGRAM;  Surgeon: Darnell Level, MD;  Location: [facility];  Service: General;  Laterality: N/A;    Prior to Admission medications   Not on File    Scheduled Meds: .  acetaminophen  1,000 mg Oral Q8H   Continuous Infusions: . sodium chloride    . dextrose 5 % and 0.9 % NaCl with KCl 20 mEq/L 100 mL/hr at 02/05/20 0207  . methocarbamol (ROBAXIN) IV 500 mg (02/04/20 2112)   PRN Meds:.HYDROmorphone (DILAUDID) injection, ondansetron **OR** ondansetron (ZOFRAN) IV, oxyCODONE, zolpidem  Allergies as of 02/03/2020 - Review Complete 02/03/2020  Allergen Reaction Noted  . Pravachol [pravastatin]  02/03/2020    History reviewed. No pertinent family history.  Social History   Socioeconomic History  . Marital status: Married    Spouse name: Not on file  . Number of children: Not on file  . Years of education: Not on file  . Highest education level: Not on file  Occupational History  . Not on file  Tobacco Use  . Smoking status: Never Smoker  . Smokeless tobacco: Never Used  Substance and Sexual Activity  . Alcohol use: Not on file  . Drug use: Not on file  . Sexual activity: Not on file  Other Topics Concern  . Not on file  Social History Narrative  . Not on file   Social Determinants of Health   Financial Resource Strain: Not on file  Food Insecurity: Not on file  Transportation Needs: Not on file  Physical Activity: Not on file  Stress: Not on file  Social Connections: Not on file  Intimate Partner Violence: Not on file    Review of Systems: Review of Systems  Constitutional: Negative for chills, fever and weight loss.  HENT: Negative for hearing loss and tinnitus.   Eyes: Negative for pain and redness.  Respiratory: Negative for cough and shortness of breath.   Cardiovascular: Negative for chest pain and palpitations.  Gastrointestinal: Positive for abdominal pain, nausea and vomiting. Negative  for blood in stool, constipation, diarrhea, heartburn and melena.  Genitourinary: Negative for flank pain and hematuria.  Musculoskeletal: Negative for falls and joint pain.  Skin: Negative for itching and rash.    Physical Exam: Vital  signs: Vitals:   02/05/20 0129 02/05/20 0537  BP: (!) 145/70 101/66  Pulse: (!) 58 61  Resp: 16 16  Temp: 98 F (36.7 C) 97.8 F (36.6 C)  SpO2: 95% 97%   Last BM Date: 01/29/20  Physical Exam Vitals reviewed.  Constitutional:      General: She is not in acute distress. HENT:     Head: Normocephalic and atraumatic.     Nose: Nose normal. No congestion.     Mouth/Throat:     Mouth: Mucous membranes are moist.     Pharynx: Oropharynx is clear.  Eyes:     General: No scleral icterus.    Extraocular Movements: Extraocular movements intact.     Conjunctiva/sclera: Conjunctivae normal.  Cardiovascular:     Rate and Rhythm: Normal rate and regular rhythm.     Pulses: Normal pulses.  Pulmonary:     Effort: Pulmonary effort is normal. No respiratory distress.     Breath sounds: Normal breath sounds.  Abdominal:     General: Bowel sounds are normal. There is no distension.     Palpations: Abdomen is soft. There is no mass.     Tenderness: There is no abdominal tenderness. There is no guarding or rebound.     Hernia: No hernia is present.  Musculoskeletal:        General: No swelling or tenderness.     Cervical back: Normal range of motion and neck supple.  Skin:    General: Skin is warm and dry.     Coloration: Skin is not jaundiced.  Neurological:     General: No focal deficit present.     Mental Status: She is alert and oriented to person, place, and time.  Psychiatric:        Behavior: Behavior normal.     GI:  Lab Results: Recent Labs    02/03/20 1023 02/04/20 0244 02/05/20 0300  WBC 11.3* 9.7 11.9*  HGB 16.9* 13.7 14.7  HCT 51.8* 42.5 44.8  PLT 378 314 343   BMET Recent Labs    02/03/20 1023 02/04/20 0244  NA 136 139  K 3.9 3.3*  CL 97* 103  CO2 27 26  GLUCOSE 155* 111*  BUN 17 16  CREATININE 1.02* 0.71  CALCIUM 9.7 8.6*   LFT Recent Labs    02/04/20 0244  PROT 6.1*  ALBUMIN 3.0*  AST 216*  ALT 491*  ALKPHOS 169*  BILITOT 1.1    PT/INR No results for input(s): LABPROT, INR in the last 72 hours.   Studies/Results: DG Cholangiogram Operative  Result Date: 02/04/2020 CLINICAL DATA:  Abdominal pain EXAM: INTRAOPERATIVE CHOLANGIOGRAM TECHNIQUE: Cholangiographic images from the C-arm fluoroscopic device were submitted for interpretation post-operatively. Please see the procedural report for the amount of contrast and the fluoroscopy time utilized. COMPARISON:  Ultrasound 02/03/2020 FINDINGS: Several small mobile filling defects in the distal common duct. Intrahepatic ducts are incompletely visualized, appearing distended centrally. No significant contrast passes into the duodenum. : Obstructing filling defects in the distal CBD suggesting choledocholithiasis. As requested, critical Value/emergent results were called by telephone at the time of interpretation on 02/04/2020 at 4:06 pm to provider TODD GERKIN in the OR, who verbally acknowledged these results. Electronically Signed   By: Ronald Pippins.D.  On: 02/04/2020 16:07   US Abdomen Limited RUQ (LIVER/GB)  Result Date: 02/03/2020 CLINICAL DATA:  RIGHT upper quadrant/epigastric pain EXAM: ULTRASOUND ABDOMEN LIMITED RIGHT UPPER QUADRANT COMPARISON:  August 08, 2013 FINDINGS: Gallbladder: There are multiple gallstones. There is gallbladder wall thickening. The gallbladder is distended. No definitive pericholecystic fluid identified. Sonographic Eulah Pont sign is negative. Common bile duct: Diameter: 7 mm, the upper limits of normal Liver: Diffusely increased hepatic echogenicity. Portal vein is patent on color Doppler imaging with normal direction of blood flow towards the liver. Other: None. IMPRESSION: 1. Cholelithiasis and gallbladder wall thickening within a distended gallbladder. Although sonographic Eulah Pont sign was negative, findings are concerning for acute cholecystitis. 2. Hepatic steatosis. Electronically Signed   By: Meda Klinefelter MD   On: 02/03/2020 14:59     Impression: Abnormal IOC, suspected choledocholithiasis.  Cholecystitis s/p cholecystectomy 02/04/2020. -LFTs trending down.  Yesterday, T bili 1.1/AST 216/ALT 491/ALP 169 as compared to T bili 3.1/AST 14/ALT 671/ALP 193 on 12/19. -Mild leukocytosis, WBC is 11.9 -IOC 02/04/2020:  Obstructing filling defects in the distal CBD suggesting choledocholithiasis.  Plan: ERCP today with Dr. Dulce Sellar.  We thoroughly discussed the procedure with patient to include nature, alternatives, benefits, and risks (including but not limited to pancreatitis, bleeding, infection, perforation, anesthesia/cardiac and pulmonary medications).  Patient verbalized understanding and gave verbal consent to proceed with ERCP.  Continue to trend LFTs.  [facility] will follow.   LOS: 2 days   Edrick Kins  PA-C 02/05/2020, 8:42 AM  Contact #  [phone]

## 2020-02-05 NOTE — Transfer of Care (Signed)
Immediate Anesthesia Transfer of Care Note  Patient: Katie Keller  Procedure(s) Performed: ENDOSCOPIC RETROGRADE CHOLANGIOPANCREATOGRAPHY (ERCP) (N/A ) SPHINCTEROTOMY REMOVAL OF STONES  Patient Location: PACU  Anesthesia Type:General  Level of Consciousness: awake, alert , oriented and patient cooperative  Airway & Oxygen Therapy: Patient Spontanous Breathing and Patient connected to face mask oxygen  Post-op Assessment: Report given to RN, Post -op Vital signs reviewed and stable and Patient moving all extremities X 4  Post vital signs: stable  Last Vitals:  Vitals Value Taken Time  BP 109/57 02/05/20 1220  Temp 36.7 C 02/05/20 1215  Pulse 52 02/05/20 1229  Resp 15 02/05/20 1229  SpO2 93 % 02/05/20 1229  Vitals shown include unvalidated device data.  Last Pain:  Vitals:   02/05/20 1220  TempSrc:   PainSc: 0-No pain      Patients Stated Pain Goal: 3 (02/05/20 0243)  Complications: No complications documented.

## 2020-02-05 NOTE — Progress Notes (Signed)
RN called handoff report to floor RN.  All vital signs stable.  NT transported patient back to inpatient room.

## 2020-02-05 NOTE — Plan of Care (Signed)
  Problem: Education: Goal: Knowledge of General Education information will improve Description Including pain rating scale, medication(s)/side effects and non-pharmacologic comfort measures Outcome: Progressing   

## 2020-02-06 LAB — COMPREHENSIVE METABOLIC PANEL
ALT: 453 U/L — ABNORMAL HIGH (ref 0–44)
AST: 129 U/L — ABNORMAL HIGH (ref 15–41)
Albumin: 3.1 g/dL — ABNORMAL LOW (ref 3.5–5.0)
Alkaline Phosphatase: 184 U/L — ABNORMAL HIGH (ref 38–126)
Anion gap: 10 (ref 5–15)
BUN: 13 mg/dL (ref 6–20)
CO2: 23 mmol/L (ref 22–32)
Calcium: 8.9 mg/dL (ref 8.9–10.3)
Chloride: 104 mmol/L (ref 98–111)
Creatinine, Ser: 0.82 mg/dL (ref 0.44–1.00)
GFR, Estimated: >60 mL/min (ref >60)
Glucose, Bld: 141 mg/dL — ABNORMAL HIGH (ref 70–99)
Potassium: 4.7 mmol/L (ref 3.5–5.1)
Sodium: 137 mmol/L (ref 135–145)
Total Bilirubin: 1 mg/dL (ref 0.3–1.2)
Total Protein: 6.3 g/dL — ABNORMAL LOW (ref 6.5–8.1)

## 2020-02-06 LAB — CBC
HCT: 44.4 % (ref 36.0–46.0)
Hemoglobin: 14 g/dL (ref 12.0–15.0)
MCH: 28.1 pg (ref 26.0–34.0)
MCHC: 31.5 g/dL (ref 30.0–36.0)
MCV: 89 fL (ref 80.0–100.0)
Platelets: 303 10*3/uL (ref 150–400)
RBC: 4.99 MIL/uL (ref 3.87–5.11)
RDW: 13.7 % (ref 11.5–15.5)
WBC: 13.9 10*3/uL — ABNORMAL HIGH (ref 4.0–10.5)
nRBC: 0 % (ref 0.0–0.2)

## 2020-02-06 LAB — LIPASE, BLOOD: Lipase: 57 U/L — ABNORMAL HIGH (ref 11–51)

## 2020-02-06 LAB — SURGICAL PATHOLOGY

## 2020-02-06 NOTE — Discharge Summary (Signed)
Physician Discharge Summary [facility]  Patient ID: Katie Keller MRN: [id number] DOB/AGE: 57-Jun-1962 57 y.o.  Admit date: 02/03/2020  Discharge date: 02/06/2020  Discharge Diagnoses:  Active Problems:   Acute cholecystitis   Discharged Condition: good  Hospital Course: Patient was admitted with cholecystitis, cholelithiasis, and intractable nausea and emesis.  Patient underwent lap chole with IOC with findings of cholecystitis, cholelithiasis, and distal CBD stones.  Seen in consultation by GI and underwent ERCP with stone extraction by Dr. Dulce Sellar on POD#1.  Did well after procedure.  Patient was prepared for discharge home on POD#2.  Consults: GI  Treatments: surgery: lap chole with IOC, ERCP with stone extraction  Discharge Exam: Blood pressure 119/78, pulse 62, temperature 98.6 F (37 C), resp. rate 16, height 5\' 4"  (1.626 m), weight 83.4 kg, SpO2 95 %. HEENT - clear Neck - soft Chest - clear bilaterally Cor - RRR Abd - soft, wounds dry and intact with Dermabond in place  Disposition: Home  Discharge Instructions    Diet - low sodium heart healthy   Complete by: As directed    Discharge instructions   Complete by: As directed    [facility]  LAPAROSCOPIC SURGERY:  POST-OP INSTRUCTIONS  Always review your discharge instruction sheet given to you by the facility where your surgery was performed.  A prescription for pain medication may be given to you upon discharge.  Take your pain medication as prescribed.  If narcotic pain medicine is not needed, then you may take acetaminophen (Tylenol) or ibuprofen (Advil) as needed.  Take your usually prescribed medications unless otherwise directed.  If you need a refill on your pain medication, please contact your pharmacy.  They will contact our office to request authorization. Prescriptions will not be filled after 5 P.M. or on weekends.  You should follow a light diet the  first few days after arrival home, such as soup and crackers or toast.  Be sure to include plenty of fluids daily.  Most patients will experience some swelling and bruising in the area of the incisions.  Ice packs will help.  Swelling and bruising can take several days to resolve.   It is common to experience some constipation after surgery.  Increasing fluid intake and taking a stool softener (such as Colace) will usually help or prevent this problem from occurring.  A mild laxative (Milk of Magnesia or Miralax) should be taken according to package instructions if there has been no bowel movement after 48 hours.  You will likely have Dermabond (topical glue) over your incisions.  This seals the incisions and allows you to bathe and shower at any time after your surgery.  Glue should remain in place for up to 10 days.  It may be removed after 10 days by pealing off the Dermabond material or using Vaseline or naval jelly to remove.  If you have steri-strips over your incisions, you may remove the gauze bandage on the second day after surgery, and you may shower at that time.  Leave your steri-strips (small skin tapes) in place directly over the incision.  These strips should remain on the skin for 5-7 days and then be removed.  You may get them wet in the shower and pat them dry.  Any sutures or staples will be removed at the office during your follow-up visit.  ACTIVITIES:  You may resume regular (light) daily activities beginning the next day - such as daily self-care, walking,  climbing stairs - gradually increasing activities as tolerated.  You may have sexual intercourse when it is comfortable.  Refrain from any heavy lifting or straining until approved by your doctor.  You may drive when you are no longer taking prescription pain medication, when you can comfortably wear a seatbelt, and when you can safely maneuver your car and apply brakes.  You should see your doctor in the office for a  follow-up appointment approximately 2-3 weeks after your surgery.  Make sure that you call for this appointment within a day or two after you arrive home to insure a convenient appointment time.  WHEN TO CALL YOUR DOCTOR: Fever over 101.0 Inability to urinate Continued bleeding from incision Increased pain, redness, or drainage from the incision Increasing abdominal pain  The clinic staff is available to answer your questions during regular business hours.  Please don't hesitate to call and ask to speak to one of the nurses for clinical concerns.  If you have a medical emergency, go to the nearest emergency room or call 911.  A surgeon from [facility] is always on call for the hospital.  Velora Heckler, MD, [facility] Office: [phone] Toll Free:  [phone] FAX [phone]  Website: [email or web address]   Increase activity slowly   Complete by: As directed    No dressing needed   Complete by: As directed      Allergies as of 02/06/2020      Reactions   Pravachol [pravastatin]       Medication List    You have not been prescribed any medications.          Discharge Care Instructions  (From admission, onward)         Start     Ordered   02/06/20 0000  No dressing needed        02/06/20 4193          Follow-up Information    [facility] Follow up on 02/26/2020.   Specialty: General Surgery Why: your appointment is at 8 AM.  Be at the office 30 minutes early for check in.  Bring photo ID and insurance information.   Contact information: [address] [place] [address] [phone]               Darnell Level, MD [facility] Office: [phone]   Signed: Darnell Level 02/06/2020, 7:39 AM

## 2020-02-06 NOTE — Plan of Care (Signed)
Patient discharged home in stable condition, waiting on her ride 

## 2020-02-19 DIAGNOSIS — Z713 Dietary counseling and surveillance: Secondary | ICD-10-CM | POA: Diagnosis not present

## 2020-02-19 DIAGNOSIS — L280 Lichen simplex chronicus: Secondary | ICD-10-CM | POA: Diagnosis not present

## 2020-03-06 DIAGNOSIS — Z20822 Contact with and (suspected) exposure to covid-19: Secondary | ICD-10-CM | POA: Diagnosis not present

## 2020-03-20 DIAGNOSIS — Z713 Dietary counseling and surveillance: Secondary | ICD-10-CM | POA: Diagnosis not present

## 2020-04-17 DIAGNOSIS — Z713 Dietary counseling and surveillance: Secondary | ICD-10-CM | POA: Diagnosis not present

## 2020-05-22 DIAGNOSIS — R7303 Prediabetes: Secondary | ICD-10-CM | POA: Diagnosis not present

## 2020-05-22 DIAGNOSIS — E559 Vitamin D deficiency, unspecified: Secondary | ICD-10-CM | POA: Diagnosis not present

## 2020-05-22 DIAGNOSIS — E782 Mixed hyperlipidemia: Secondary | ICD-10-CM | POA: Diagnosis not present

## 2020-05-28 DIAGNOSIS — Z713 Dietary counseling and surveillance: Secondary | ICD-10-CM | POA: Diagnosis not present

## 2020-05-30 DIAGNOSIS — Z20822 Contact with and (suspected) exposure to covid-19: Secondary | ICD-10-CM | POA: Diagnosis not present

## 2020-06-11 DIAGNOSIS — E782 Mixed hyperlipidemia: Secondary | ICD-10-CM | POA: Diagnosis not present

## 2020-06-11 DIAGNOSIS — E559 Vitamin D deficiency, unspecified: Secondary | ICD-10-CM | POA: Diagnosis not present

## 2020-06-11 DIAGNOSIS — R7303 Prediabetes: Secondary | ICD-10-CM | POA: Diagnosis not present

## 2020-06-11 DIAGNOSIS — Z1231 Encounter for screening mammogram for malignant neoplasm of breast: Secondary | ICD-10-CM | POA: Diagnosis not present

## 2020-07-10 DIAGNOSIS — E785 Hyperlipidemia, unspecified: Secondary | ICD-10-CM | POA: Diagnosis not present

## 2020-07-11 DIAGNOSIS — E785 Hyperlipidemia, unspecified: Secondary | ICD-10-CM | POA: Diagnosis not present

## 2020-07-11 DIAGNOSIS — Z6830 Body mass index (BMI) 30.0-30.9, adult: Secondary | ICD-10-CM | POA: Diagnosis not present

## 2020-07-28 DIAGNOSIS — Z1231 Encounter for screening mammogram for malignant neoplasm of breast: Secondary | ICD-10-CM | POA: Diagnosis not present

## 2020-07-31 DIAGNOSIS — Z713 Dietary counseling and surveillance: Secondary | ICD-10-CM | POA: Diagnosis not present

## 2020-09-15 DIAGNOSIS — Z20822 Contact with and (suspected) exposure to covid-19: Secondary | ICD-10-CM | POA: Diagnosis not present

## 2021-01-19 DIAGNOSIS — M542 Cervicalgia: Secondary | ICD-10-CM | POA: Diagnosis not present

## 2021-01-19 DIAGNOSIS — M62838 Other muscle spasm: Secondary | ICD-10-CM | POA: Diagnosis not present

## 2021-01-19 DIAGNOSIS — M9901 Segmental and somatic dysfunction of cervical region: Secondary | ICD-10-CM | POA: Diagnosis not present

## 2021-01-19 DIAGNOSIS — M9902 Segmental and somatic dysfunction of thoracic region: Secondary | ICD-10-CM | POA: Diagnosis not present

## 2021-01-26 DIAGNOSIS — K219 Gastro-esophageal reflux disease without esophagitis: Secondary | ICD-10-CM | POA: Diagnosis not present

## 2021-01-26 DIAGNOSIS — R7303 Prediabetes: Secondary | ICD-10-CM | POA: Diagnosis not present

## 2021-01-26 DIAGNOSIS — E7800 Pure hypercholesterolemia, unspecified: Secondary | ICD-10-CM | POA: Diagnosis not present

## 2021-01-27 DIAGNOSIS — M62838 Other muscle spasm: Secondary | ICD-10-CM | POA: Diagnosis not present

## 2021-01-27 DIAGNOSIS — M542 Cervicalgia: Secondary | ICD-10-CM | POA: Diagnosis not present

## 2021-01-27 DIAGNOSIS — M9902 Segmental and somatic dysfunction of thoracic region: Secondary | ICD-10-CM | POA: Diagnosis not present

## 2021-01-27 DIAGNOSIS — M9901 Segmental and somatic dysfunction of cervical region: Secondary | ICD-10-CM | POA: Diagnosis not present

## 2021-01-28 DIAGNOSIS — Z0000 Encounter for general adult medical examination without abnormal findings: Secondary | ICD-10-CM | POA: Diagnosis not present

## 2021-08-03 ENCOUNTER — Other Ambulatory Visit: Payer: Self-pay | Admitting: Internal Medicine

## 2021-08-03 DIAGNOSIS — E785 Hyperlipidemia, unspecified: Secondary | ICD-10-CM

## 2021-08-26 ENCOUNTER — Ambulatory Visit (INDEPENDENT_AMBULATORY_CARE_PROVIDER_SITE_OTHER): Payer: Self-pay

## 2021-08-26 DIAGNOSIS — E785 Hyperlipidemia, unspecified: Secondary | ICD-10-CM

## 2021-12-03 DIAGNOSIS — M9906 Segmental and somatic dysfunction of lower extremity: Secondary | ICD-10-CM | POA: Diagnosis not present

## 2021-12-03 DIAGNOSIS — M9903 Segmental and somatic dysfunction of lumbar region: Secondary | ICD-10-CM | POA: Diagnosis not present

## 2021-12-03 DIAGNOSIS — M9902 Segmental and somatic dysfunction of thoracic region: Secondary | ICD-10-CM | POA: Diagnosis not present

## 2021-12-03 DIAGNOSIS — M9905 Segmental and somatic dysfunction of pelvic region: Secondary | ICD-10-CM | POA: Diagnosis not present

## 2021-12-07 DIAGNOSIS — M9902 Segmental and somatic dysfunction of thoracic region: Secondary | ICD-10-CM | POA: Diagnosis not present

## 2021-12-07 DIAGNOSIS — M9903 Segmental and somatic dysfunction of lumbar region: Secondary | ICD-10-CM | POA: Diagnosis not present

## 2021-12-07 DIAGNOSIS — M9906 Segmental and somatic dysfunction of lower extremity: Secondary | ICD-10-CM | POA: Diagnosis not present

## 2021-12-07 DIAGNOSIS — M9905 Segmental and somatic dysfunction of pelvic region: Secondary | ICD-10-CM | POA: Diagnosis not present

## 2021-12-11 DIAGNOSIS — M9902 Segmental and somatic dysfunction of thoracic region: Secondary | ICD-10-CM | POA: Diagnosis not present

## 2021-12-11 DIAGNOSIS — M9903 Segmental and somatic dysfunction of lumbar region: Secondary | ICD-10-CM | POA: Diagnosis not present

## 2021-12-11 DIAGNOSIS — M9906 Segmental and somatic dysfunction of lower extremity: Secondary | ICD-10-CM | POA: Diagnosis not present

## 2021-12-11 DIAGNOSIS — M9905 Segmental and somatic dysfunction of pelvic region: Secondary | ICD-10-CM | POA: Diagnosis not present

## 2021-12-22 DIAGNOSIS — M9901 Segmental and somatic dysfunction of cervical region: Secondary | ICD-10-CM | POA: Diagnosis not present

## 2021-12-22 DIAGNOSIS — M9903 Segmental and somatic dysfunction of lumbar region: Secondary | ICD-10-CM | POA: Diagnosis not present

## 2021-12-22 DIAGNOSIS — M9902 Segmental and somatic dysfunction of thoracic region: Secondary | ICD-10-CM | POA: Diagnosis not present

## 2021-12-22 DIAGNOSIS — M9905 Segmental and somatic dysfunction of pelvic region: Secondary | ICD-10-CM | POA: Diagnosis not present

## 2021-12-22 DIAGNOSIS — M9906 Segmental and somatic dysfunction of lower extremity: Secondary | ICD-10-CM | POA: Diagnosis not present

## 2021-12-25 DIAGNOSIS — M9905 Segmental and somatic dysfunction of pelvic region: Secondary | ICD-10-CM | POA: Diagnosis not present

## 2021-12-25 DIAGNOSIS — M9901 Segmental and somatic dysfunction of cervical region: Secondary | ICD-10-CM | POA: Diagnosis not present

## 2021-12-25 DIAGNOSIS — M9902 Segmental and somatic dysfunction of thoracic region: Secondary | ICD-10-CM | POA: Diagnosis not present

## 2021-12-25 DIAGNOSIS — M9906 Segmental and somatic dysfunction of lower extremity: Secondary | ICD-10-CM | POA: Diagnosis not present

## 2021-12-25 DIAGNOSIS — M9903 Segmental and somatic dysfunction of lumbar region: Secondary | ICD-10-CM | POA: Diagnosis not present

## 2021-12-29 DIAGNOSIS — M9905 Segmental and somatic dysfunction of pelvic region: Secondary | ICD-10-CM | POA: Diagnosis not present

## 2021-12-29 DIAGNOSIS — M9906 Segmental and somatic dysfunction of lower extremity: Secondary | ICD-10-CM | POA: Diagnosis not present

## 2021-12-29 DIAGNOSIS — M9902 Segmental and somatic dysfunction of thoracic region: Secondary | ICD-10-CM | POA: Diagnosis not present

## 2021-12-29 DIAGNOSIS — M9901 Segmental and somatic dysfunction of cervical region: Secondary | ICD-10-CM | POA: Diagnosis not present

## 2021-12-29 DIAGNOSIS — M9903 Segmental and somatic dysfunction of lumbar region: Secondary | ICD-10-CM | POA: Diagnosis not present

## 2022-01-01 DIAGNOSIS — M9901 Segmental and somatic dysfunction of cervical region: Secondary | ICD-10-CM | POA: Diagnosis not present

## 2022-01-01 DIAGNOSIS — M9905 Segmental and somatic dysfunction of pelvic region: Secondary | ICD-10-CM | POA: Diagnosis not present

## 2022-01-01 DIAGNOSIS — M9906 Segmental and somatic dysfunction of lower extremity: Secondary | ICD-10-CM | POA: Diagnosis not present

## 2022-01-01 DIAGNOSIS — M9902 Segmental and somatic dysfunction of thoracic region: Secondary | ICD-10-CM | POA: Diagnosis not present

## 2022-01-01 DIAGNOSIS — M9903 Segmental and somatic dysfunction of lumbar region: Secondary | ICD-10-CM | POA: Diagnosis not present

## 2022-01-05 DIAGNOSIS — M9901 Segmental and somatic dysfunction of cervical region: Secondary | ICD-10-CM | POA: Diagnosis not present

## 2022-01-05 DIAGNOSIS — M9905 Segmental and somatic dysfunction of pelvic region: Secondary | ICD-10-CM | POA: Diagnosis not present

## 2022-01-05 DIAGNOSIS — M9906 Segmental and somatic dysfunction of lower extremity: Secondary | ICD-10-CM | POA: Diagnosis not present

## 2022-01-05 DIAGNOSIS — M9902 Segmental and somatic dysfunction of thoracic region: Secondary | ICD-10-CM | POA: Diagnosis not present

## 2022-01-05 DIAGNOSIS — M9903 Segmental and somatic dysfunction of lumbar region: Secondary | ICD-10-CM | POA: Diagnosis not present

## 2022-01-08 DIAGNOSIS — M9903 Segmental and somatic dysfunction of lumbar region: Secondary | ICD-10-CM | POA: Diagnosis not present

## 2022-01-08 DIAGNOSIS — M9902 Segmental and somatic dysfunction of thoracic region: Secondary | ICD-10-CM | POA: Diagnosis not present

## 2022-01-08 DIAGNOSIS — M9906 Segmental and somatic dysfunction of lower extremity: Secondary | ICD-10-CM | POA: Diagnosis not present

## 2022-01-08 DIAGNOSIS — M9901 Segmental and somatic dysfunction of cervical region: Secondary | ICD-10-CM | POA: Diagnosis not present

## 2022-01-08 DIAGNOSIS — M9905 Segmental and somatic dysfunction of pelvic region: Secondary | ICD-10-CM | POA: Diagnosis not present

## 2022-01-12 DIAGNOSIS — M9903 Segmental and somatic dysfunction of lumbar region: Secondary | ICD-10-CM | POA: Diagnosis not present

## 2022-01-12 DIAGNOSIS — M9902 Segmental and somatic dysfunction of thoracic region: Secondary | ICD-10-CM | POA: Diagnosis not present

## 2022-01-12 DIAGNOSIS — M9905 Segmental and somatic dysfunction of pelvic region: Secondary | ICD-10-CM | POA: Diagnosis not present

## 2022-01-12 DIAGNOSIS — M9901 Segmental and somatic dysfunction of cervical region: Secondary | ICD-10-CM | POA: Diagnosis not present

## 2022-01-12 DIAGNOSIS — M9906 Segmental and somatic dysfunction of lower extremity: Secondary | ICD-10-CM | POA: Diagnosis not present

## 2022-01-15 DIAGNOSIS — M9901 Segmental and somatic dysfunction of cervical region: Secondary | ICD-10-CM | POA: Diagnosis not present

## 2022-01-15 DIAGNOSIS — M9902 Segmental and somatic dysfunction of thoracic region: Secondary | ICD-10-CM | POA: Diagnosis not present

## 2022-01-15 DIAGNOSIS — M9906 Segmental and somatic dysfunction of lower extremity: Secondary | ICD-10-CM | POA: Diagnosis not present

## 2022-01-15 DIAGNOSIS — M9905 Segmental and somatic dysfunction of pelvic region: Secondary | ICD-10-CM | POA: Diagnosis not present

## 2022-01-15 DIAGNOSIS — M9903 Segmental and somatic dysfunction of lumbar region: Secondary | ICD-10-CM | POA: Diagnosis not present

## 2022-01-19 DIAGNOSIS — M9902 Segmental and somatic dysfunction of thoracic region: Secondary | ICD-10-CM | POA: Diagnosis not present

## 2022-01-19 DIAGNOSIS — M9903 Segmental and somatic dysfunction of lumbar region: Secondary | ICD-10-CM | POA: Diagnosis not present

## 2022-01-19 DIAGNOSIS — M9905 Segmental and somatic dysfunction of pelvic region: Secondary | ICD-10-CM | POA: Diagnosis not present

## 2022-01-19 DIAGNOSIS — M9906 Segmental and somatic dysfunction of lower extremity: Secondary | ICD-10-CM | POA: Diagnosis not present

## 2022-01-19 DIAGNOSIS — M9901 Segmental and somatic dysfunction of cervical region: Secondary | ICD-10-CM | POA: Diagnosis not present

## 2022-01-22 DIAGNOSIS — M9903 Segmental and somatic dysfunction of lumbar region: Secondary | ICD-10-CM | POA: Diagnosis not present

## 2022-01-22 DIAGNOSIS — M9905 Segmental and somatic dysfunction of pelvic region: Secondary | ICD-10-CM | POA: Diagnosis not present

## 2022-01-22 DIAGNOSIS — M9902 Segmental and somatic dysfunction of thoracic region: Secondary | ICD-10-CM | POA: Diagnosis not present

## 2022-01-22 DIAGNOSIS — M9906 Segmental and somatic dysfunction of lower extremity: Secondary | ICD-10-CM | POA: Diagnosis not present

## 2022-01-22 DIAGNOSIS — M9901 Segmental and somatic dysfunction of cervical region: Secondary | ICD-10-CM | POA: Diagnosis not present

## 2022-01-26 DIAGNOSIS — M9903 Segmental and somatic dysfunction of lumbar region: Secondary | ICD-10-CM | POA: Diagnosis not present

## 2022-01-26 DIAGNOSIS — M9901 Segmental and somatic dysfunction of cervical region: Secondary | ICD-10-CM | POA: Diagnosis not present

## 2022-01-26 DIAGNOSIS — M9906 Segmental and somatic dysfunction of lower extremity: Secondary | ICD-10-CM | POA: Diagnosis not present

## 2022-01-26 DIAGNOSIS — M9905 Segmental and somatic dysfunction of pelvic region: Secondary | ICD-10-CM | POA: Diagnosis not present

## 2022-01-26 DIAGNOSIS — M9902 Segmental and somatic dysfunction of thoracic region: Secondary | ICD-10-CM | POA: Diagnosis not present

## 2022-01-29 DIAGNOSIS — M9902 Segmental and somatic dysfunction of thoracic region: Secondary | ICD-10-CM | POA: Diagnosis not present

## 2022-01-29 DIAGNOSIS — M9903 Segmental and somatic dysfunction of lumbar region: Secondary | ICD-10-CM | POA: Diagnosis not present

## 2022-01-29 DIAGNOSIS — M9905 Segmental and somatic dysfunction of pelvic region: Secondary | ICD-10-CM | POA: Diagnosis not present

## 2022-01-29 DIAGNOSIS — M9901 Segmental and somatic dysfunction of cervical region: Secondary | ICD-10-CM | POA: Diagnosis not present

## 2022-01-29 DIAGNOSIS — M9906 Segmental and somatic dysfunction of lower extremity: Secondary | ICD-10-CM | POA: Diagnosis not present

## 2022-02-02 DIAGNOSIS — M9903 Segmental and somatic dysfunction of lumbar region: Secondary | ICD-10-CM | POA: Diagnosis not present

## 2022-02-02 DIAGNOSIS — M9901 Segmental and somatic dysfunction of cervical region: Secondary | ICD-10-CM | POA: Diagnosis not present

## 2022-02-02 DIAGNOSIS — M9902 Segmental and somatic dysfunction of thoracic region: Secondary | ICD-10-CM | POA: Diagnosis not present

## 2022-02-02 DIAGNOSIS — M9905 Segmental and somatic dysfunction of pelvic region: Secondary | ICD-10-CM | POA: Diagnosis not present

## 2022-02-05 DIAGNOSIS — M9901 Segmental and somatic dysfunction of cervical region: Secondary | ICD-10-CM | POA: Diagnosis not present

## 2022-02-05 DIAGNOSIS — M9902 Segmental and somatic dysfunction of thoracic region: Secondary | ICD-10-CM | POA: Diagnosis not present

## 2022-02-05 DIAGNOSIS — M9905 Segmental and somatic dysfunction of pelvic region: Secondary | ICD-10-CM | POA: Diagnosis not present

## 2022-02-05 DIAGNOSIS — M9903 Segmental and somatic dysfunction of lumbar region: Secondary | ICD-10-CM | POA: Diagnosis not present

## 2022-02-09 DIAGNOSIS — M9901 Segmental and somatic dysfunction of cervical region: Secondary | ICD-10-CM | POA: Diagnosis not present

## 2022-02-09 DIAGNOSIS — M9903 Segmental and somatic dysfunction of lumbar region: Secondary | ICD-10-CM | POA: Diagnosis not present

## 2022-02-09 DIAGNOSIS — M9902 Segmental and somatic dysfunction of thoracic region: Secondary | ICD-10-CM | POA: Diagnosis not present

## 2022-02-09 DIAGNOSIS — M9905 Segmental and somatic dysfunction of pelvic region: Secondary | ICD-10-CM | POA: Diagnosis not present

## 2022-02-12 DIAGNOSIS — M9903 Segmental and somatic dysfunction of lumbar region: Secondary | ICD-10-CM | POA: Diagnosis not present

## 2022-02-12 DIAGNOSIS — M9901 Segmental and somatic dysfunction of cervical region: Secondary | ICD-10-CM | POA: Diagnosis not present

## 2022-02-12 DIAGNOSIS — M9905 Segmental and somatic dysfunction of pelvic region: Secondary | ICD-10-CM | POA: Diagnosis not present

## 2022-02-12 DIAGNOSIS — M9902 Segmental and somatic dysfunction of thoracic region: Secondary | ICD-10-CM | POA: Diagnosis not present

## 2022-02-16 DIAGNOSIS — M9903 Segmental and somatic dysfunction of lumbar region: Secondary | ICD-10-CM | POA: Diagnosis not present

## 2022-02-16 DIAGNOSIS — M9901 Segmental and somatic dysfunction of cervical region: Secondary | ICD-10-CM | POA: Diagnosis not present

## 2022-02-16 DIAGNOSIS — M9902 Segmental and somatic dysfunction of thoracic region: Secondary | ICD-10-CM | POA: Diagnosis not present

## 2022-02-16 DIAGNOSIS — M9905 Segmental and somatic dysfunction of pelvic region: Secondary | ICD-10-CM | POA: Diagnosis not present

## 2022-03-02 DIAGNOSIS — Z0000 Encounter for general adult medical examination without abnormal findings: Secondary | ICD-10-CM | POA: Diagnosis not present

## 2022-03-02 DIAGNOSIS — E785 Hyperlipidemia, unspecified: Secondary | ICD-10-CM | POA: Diagnosis not present

## 2022-03-02 DIAGNOSIS — E782 Mixed hyperlipidemia: Secondary | ICD-10-CM | POA: Diagnosis not present

## 2022-03-02 DIAGNOSIS — E7800 Pure hypercholesterolemia, unspecified: Secondary | ICD-10-CM | POA: Diagnosis not present

## 2022-03-02 DIAGNOSIS — Z1231 Encounter for screening mammogram for malignant neoplasm of breast: Secondary | ICD-10-CM | POA: Diagnosis not present

## 2022-03-02 DIAGNOSIS — M25562 Pain in left knee: Secondary | ICD-10-CM | POA: Diagnosis not present

## 2022-03-02 DIAGNOSIS — K219 Gastro-esophageal reflux disease without esophagitis: Secondary | ICD-10-CM | POA: Diagnosis not present

## 2022-03-02 DIAGNOSIS — R7303 Prediabetes: Secondary | ICD-10-CM | POA: Diagnosis not present

## 2022-03-16 DIAGNOSIS — M1711 Unilateral primary osteoarthritis, right knee: Secondary | ICD-10-CM | POA: Diagnosis not present

## 2022-03-16 DIAGNOSIS — M25562 Pain in left knee: Secondary | ICD-10-CM | POA: Diagnosis not present

## 2022-08-23 IMAGING — US US ABDOMEN LIMITED
1 series · 14 of 25 positions shown · non-contrast
Comparison: August 08, 2013

CLINICAL DATA: RIGHT upper quadrant/epigastric pain

EXAM:
ULTRASOUND ABDOMEN LIMITED RIGHT UPPER QUADRANT

[Series 1: us abdomen limited · 14 of 46 slices shown]
[im 1/46]
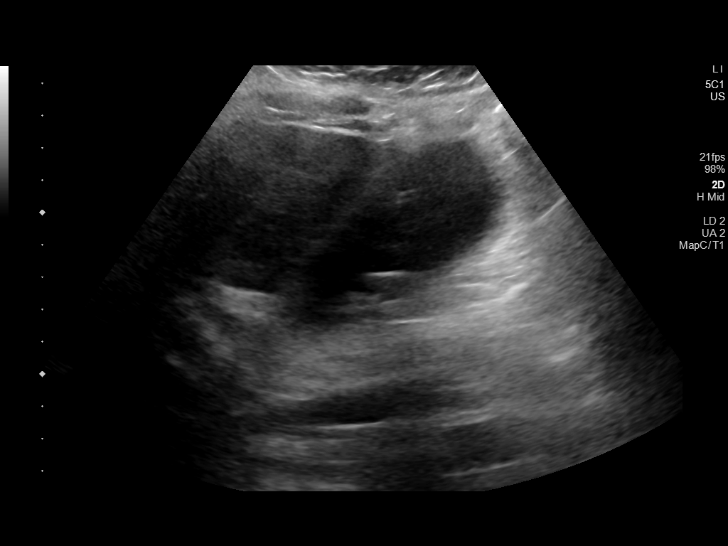
[im 4/46]
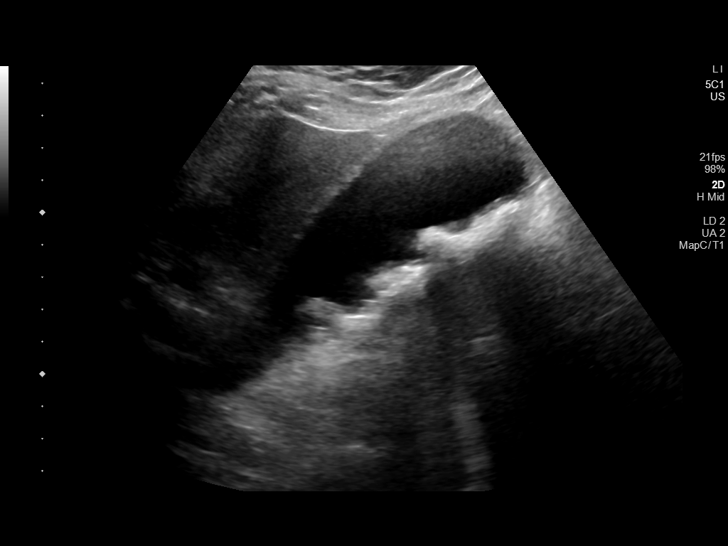
[im 8/46]
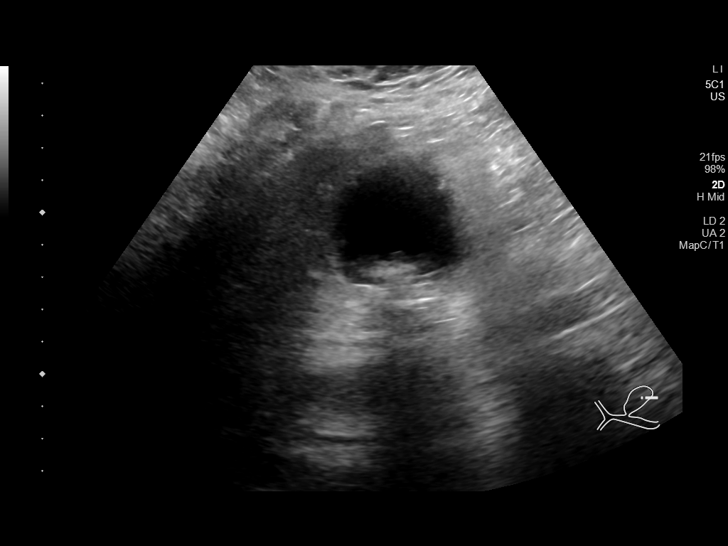
[im 12/46]
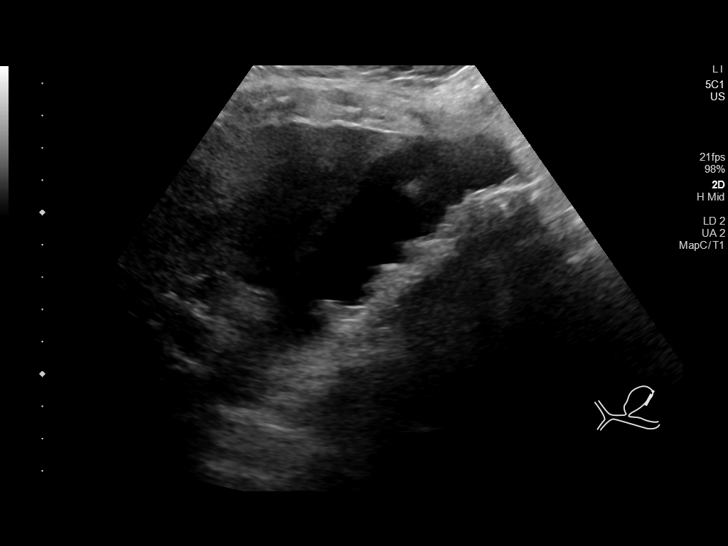
[im 16/46]
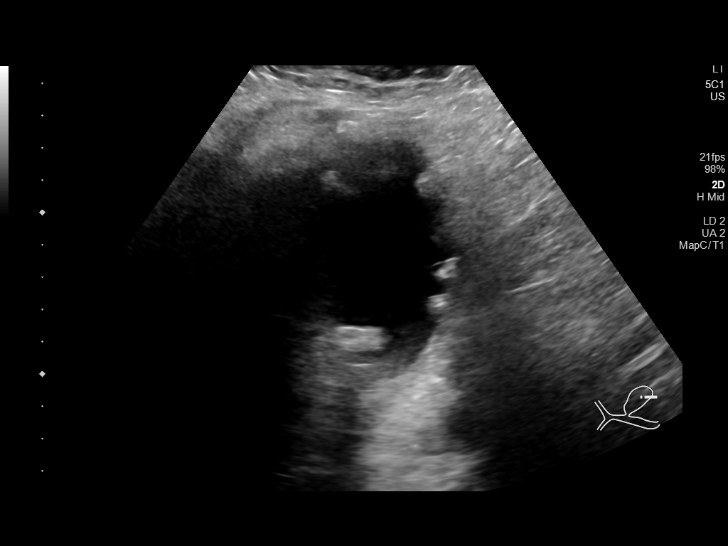
[im 17/46]
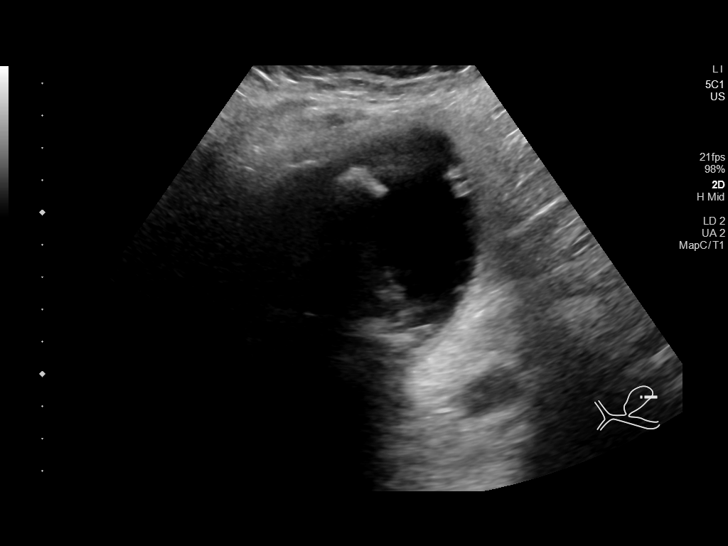
[im 21/46]
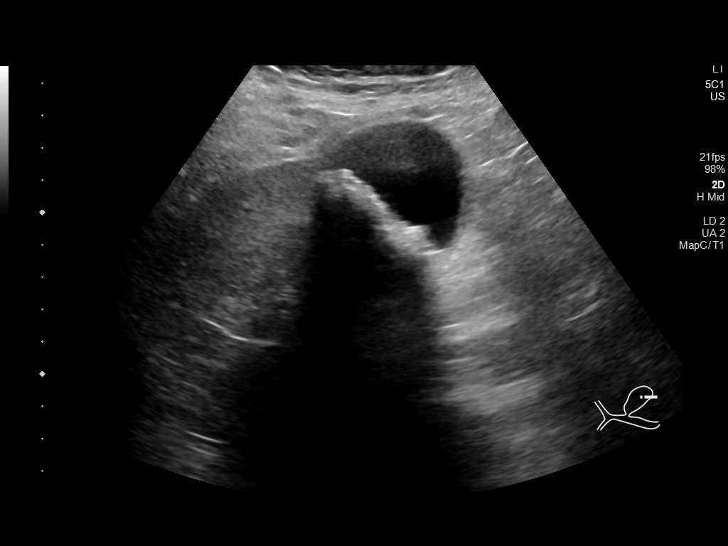
[im 25/46]
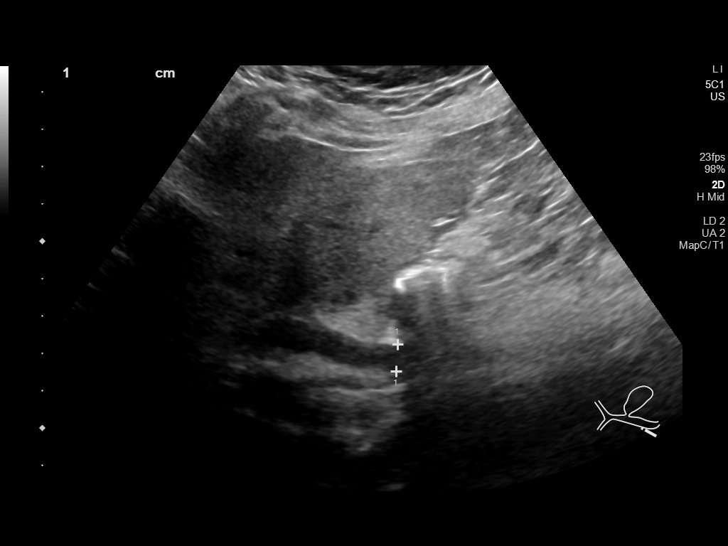
[im 29/46]
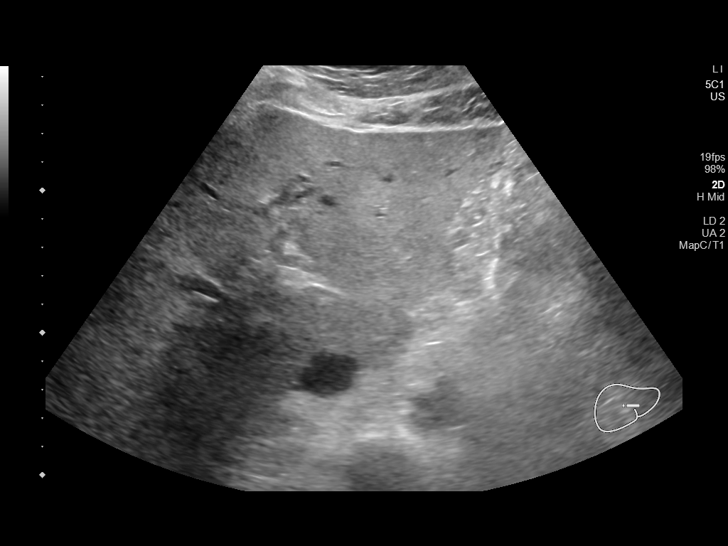
[im 31/46]
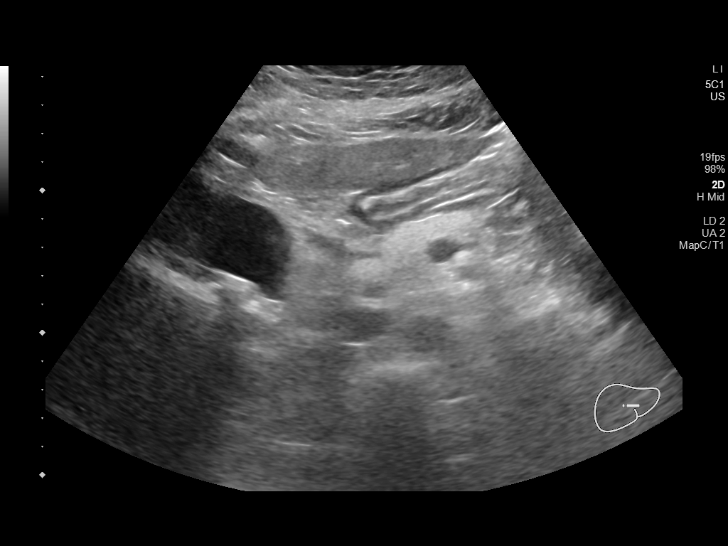
[im 34/46]
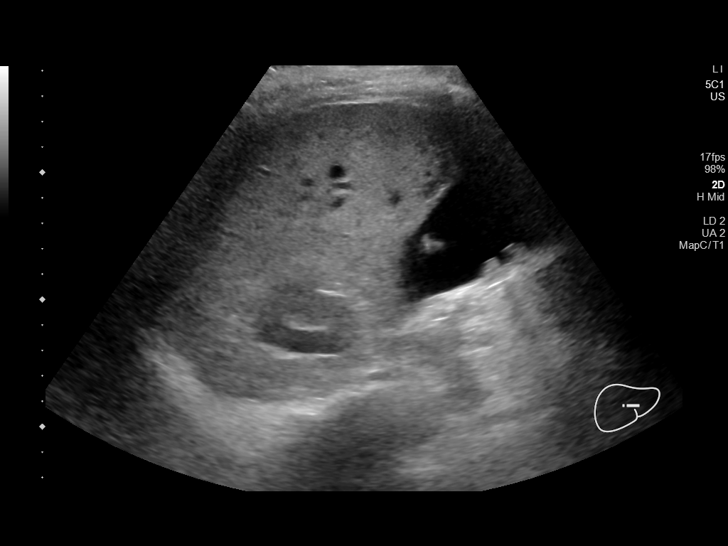
[im 38/46]
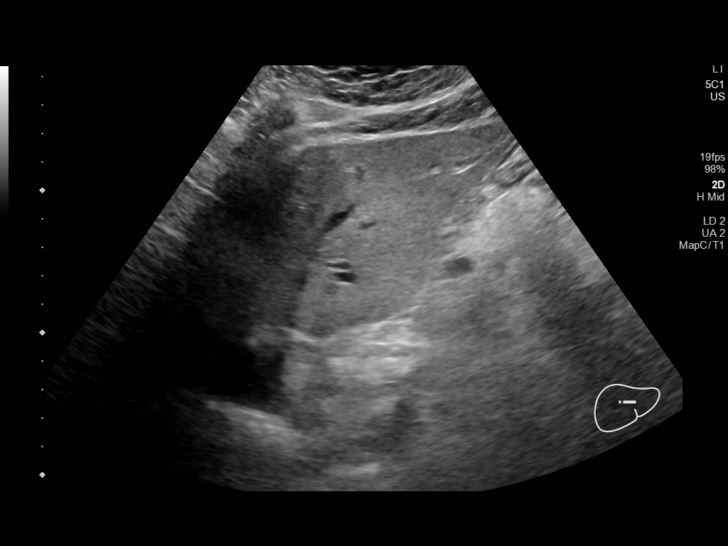
[im 42/46]
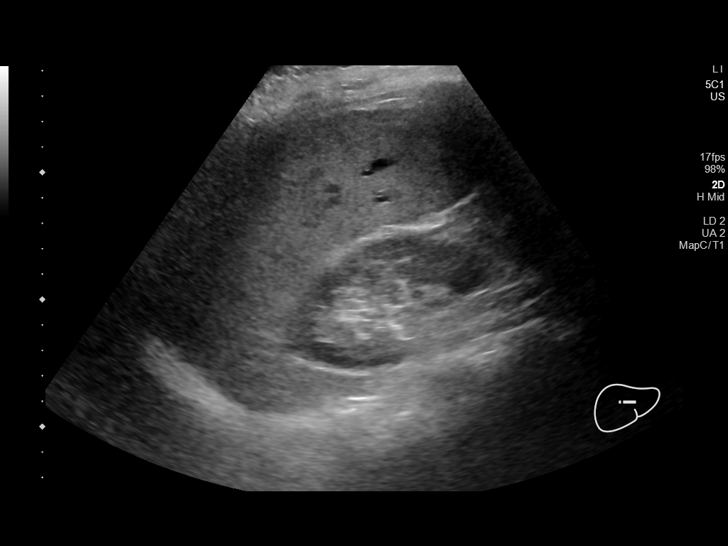
[im 46/46]
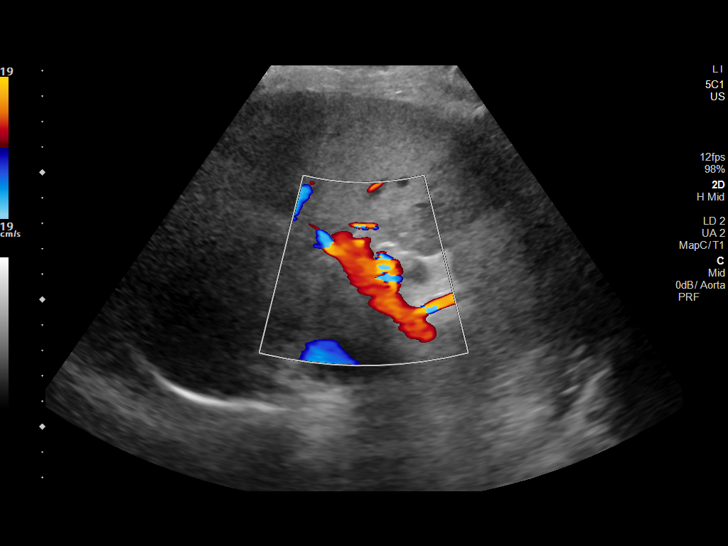

[14 of 25 positions shown; findings below may reference images not displayed]

FINDINGS: Gallbladder:

There are multiple gallstones. There is gallbladder wall thickening.
The gallbladder is distended. No definitive pericholecystic fluid
identified. Sonographic Murphy sign is negative.

Common bile duct:

Diameter: 7 mm, the upper limits of normal

Liver:

Diffusely increased hepatic echogenicity. Portal vein is patent on
color Doppler imaging with normal direction of blood flow towards
the liver.

Other: None.
IMPRESSION: 1. Cholelithiasis and gallbladder wall thickening within a distended
gallbladder. Although sonographic Murphy sign was negative, findings
are concerning for acute cholecystitis.
2. Hepatic steatosis.

## 2022-08-24 IMAGING — RF DG CHOLANGIOGRAM OPERATIVE
1 series · 5 of 5 positions shown · non-contrast
Comparison: Ultrasound 02/03/2020

CLINICAL DATA: Abdominal pain

EXAM:
INTRAOPERATIVE CHOLANGIOGRAM
TECHNIQUE: Cholangiographic images from the C-arm fluoroscopic device were
submitted for interpretation post-operatively. Please see the
procedural report for the amount of contrast and the fluoroscopy
time utilized.

[Series 1: run · 2 acquisitions, 5 frames shown]
[im 1/2]
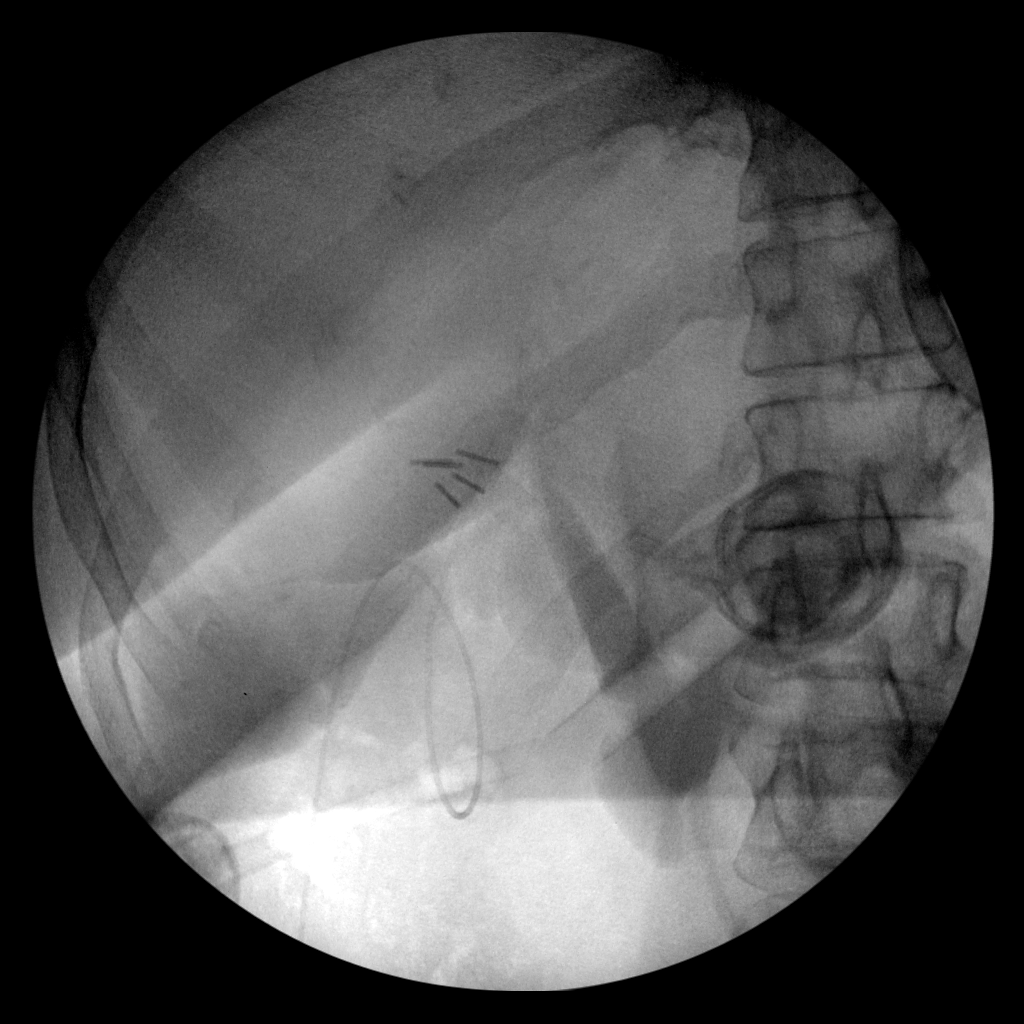
[im 2/2]
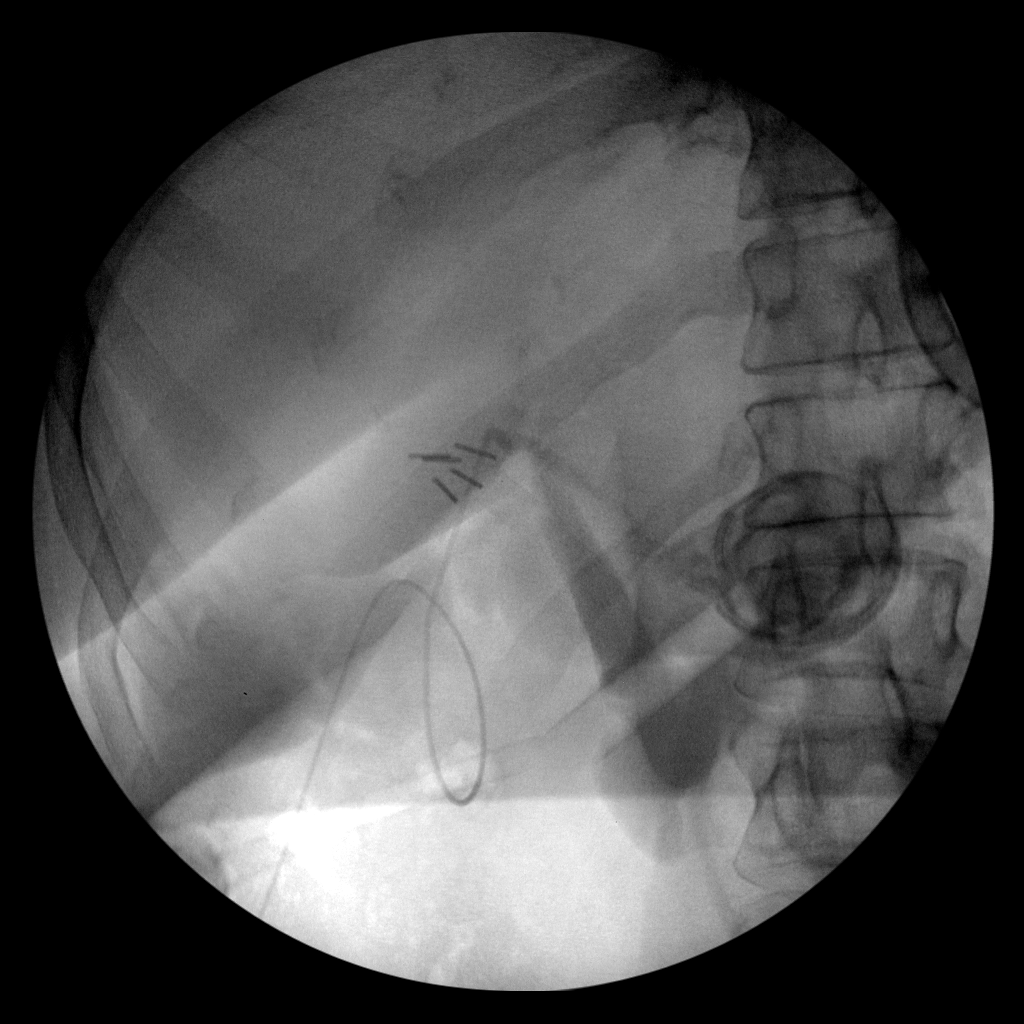
[im 2/2]
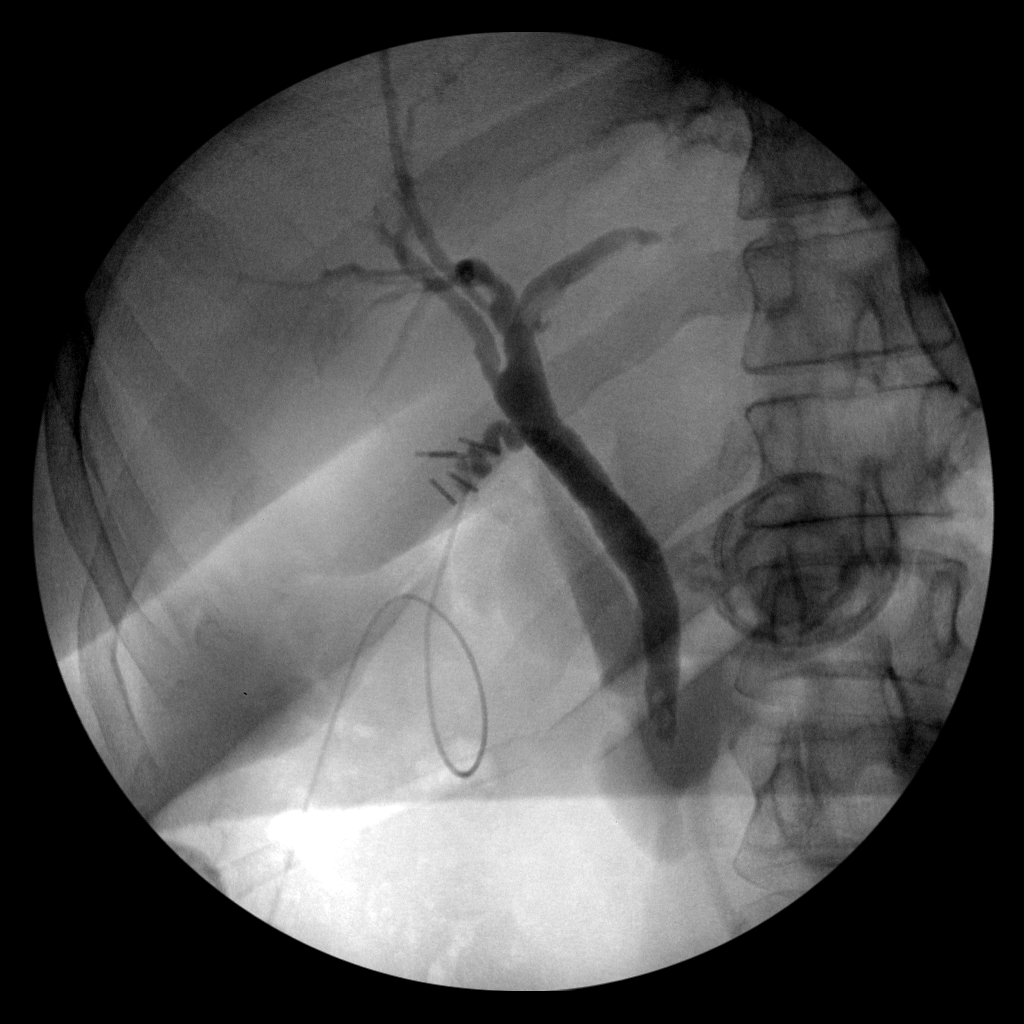
[im 2/2]
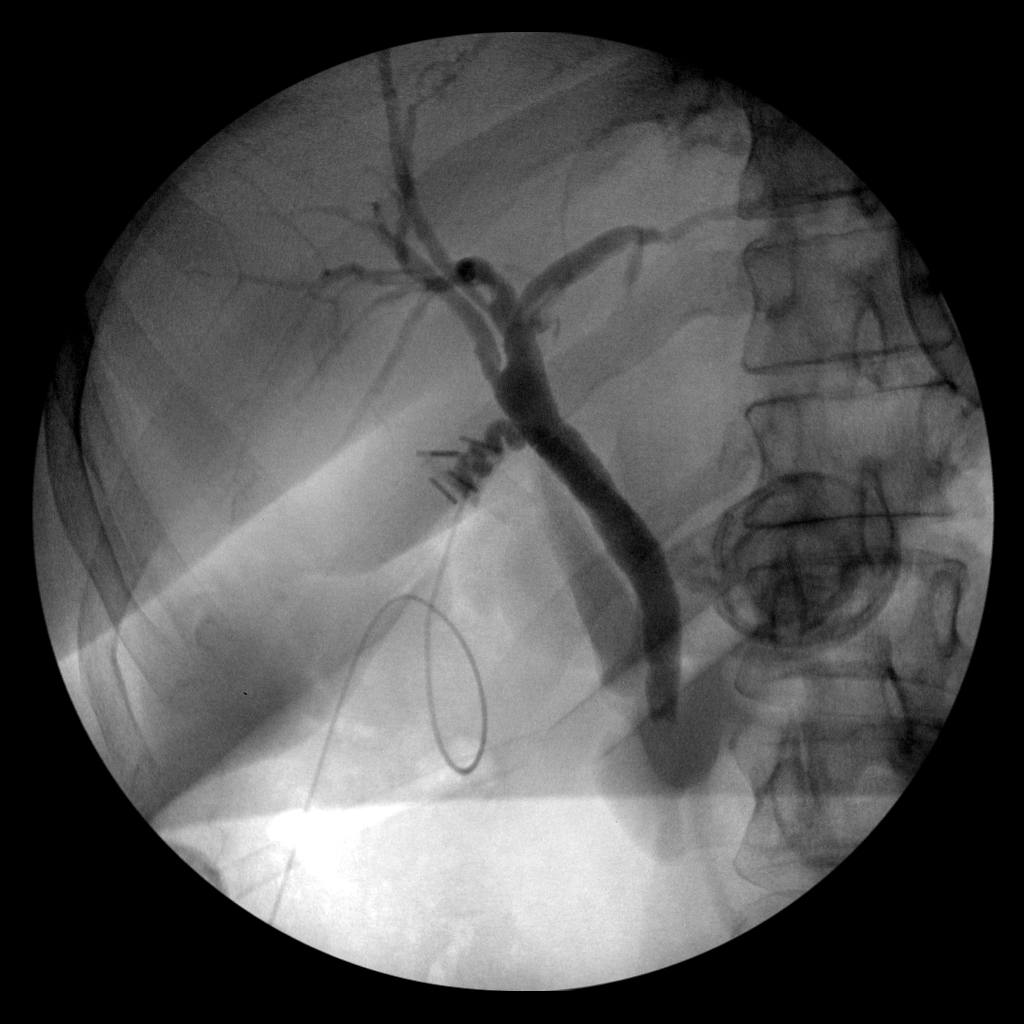
[im 2/2]
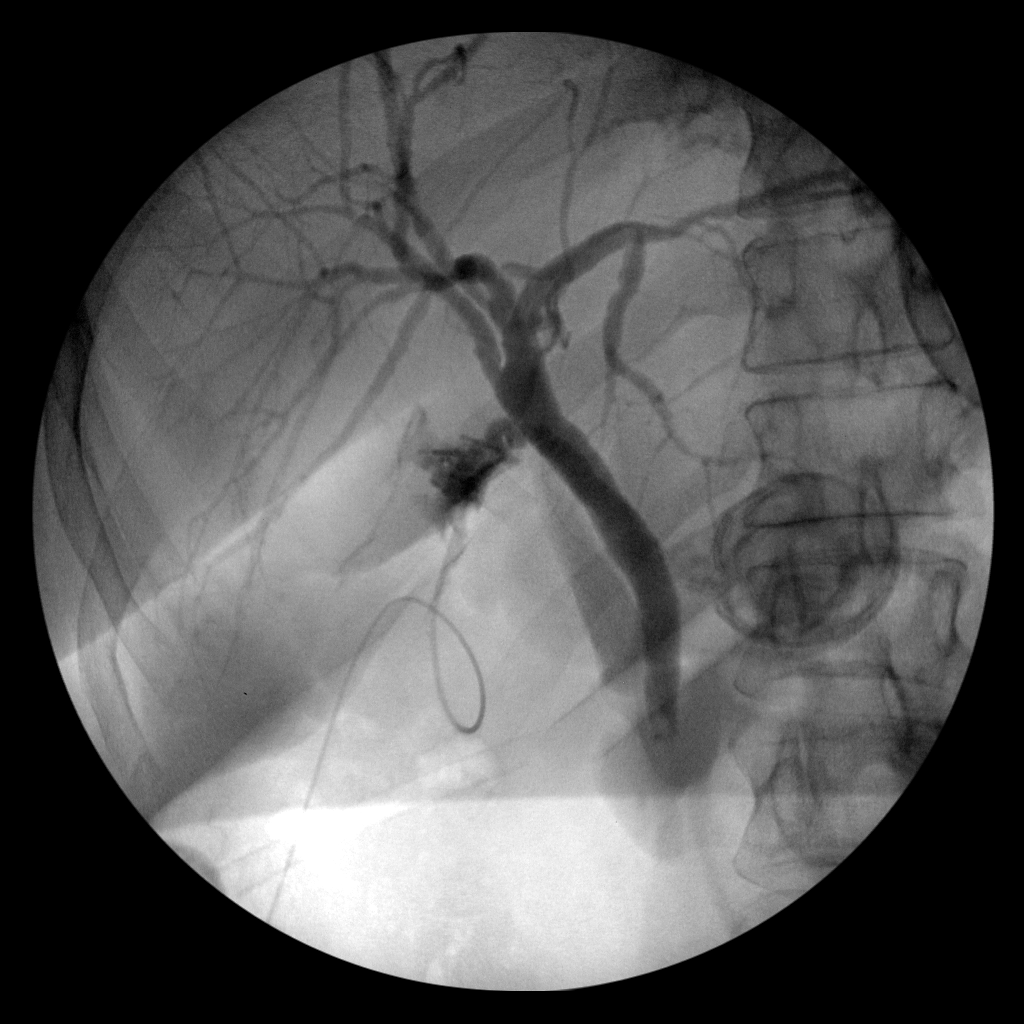

[5 of 5 positions shown; findings below may reference images not displayed]

FINDINGS: Several small mobile filling defects in the distal common duct.
Intrahepatic ducts are incompletely visualized, appearing distended
centrally. No significant contrast passes into the duodenum.

:
Obstructing filling defects in the distal CBD suggesting
choledocholithiasis.

As requested, critical Value/emergent results were called by
telephone at the time of interpretation on 02/04/2020 at [DATE] to
provider RYDA ROFIKX in the OR, who verbally acknowledged these
results.

## 2022-08-25 IMAGING — RF DG ERCP WO/W SPHINCTEROTOMY
1 series · 2 of 2 positions shown · non-contrast
Comparison: 02/04/2020

CLINICAL DATA: Distal CBD filling defects on intra op cholangiogram

EXAM:
ERCP
TECHNIQUE: Multiple spot images obtained with the fluoroscopic device and
submitted for interpretation post-procedure.

[Series 1: run · 2 of 2 slices shown]
[im 1/2]
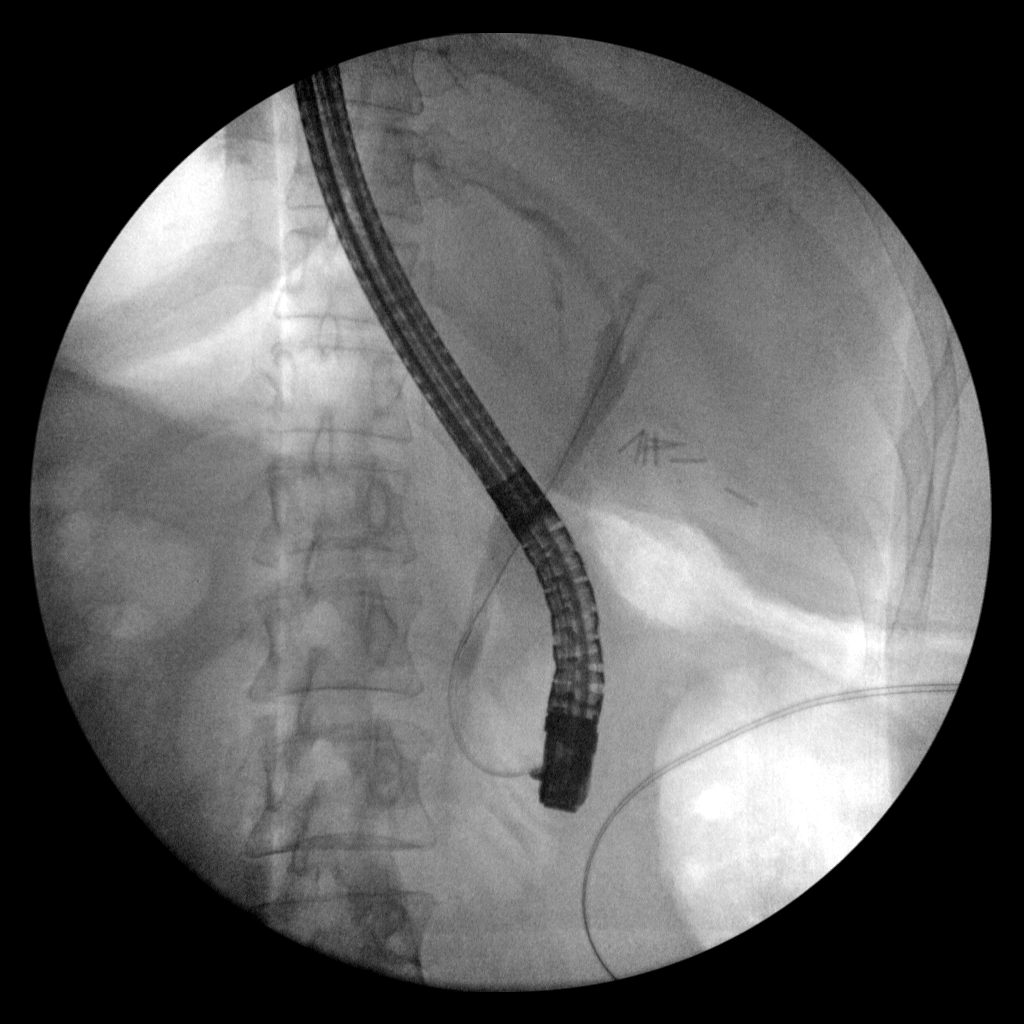
[im 2/2]
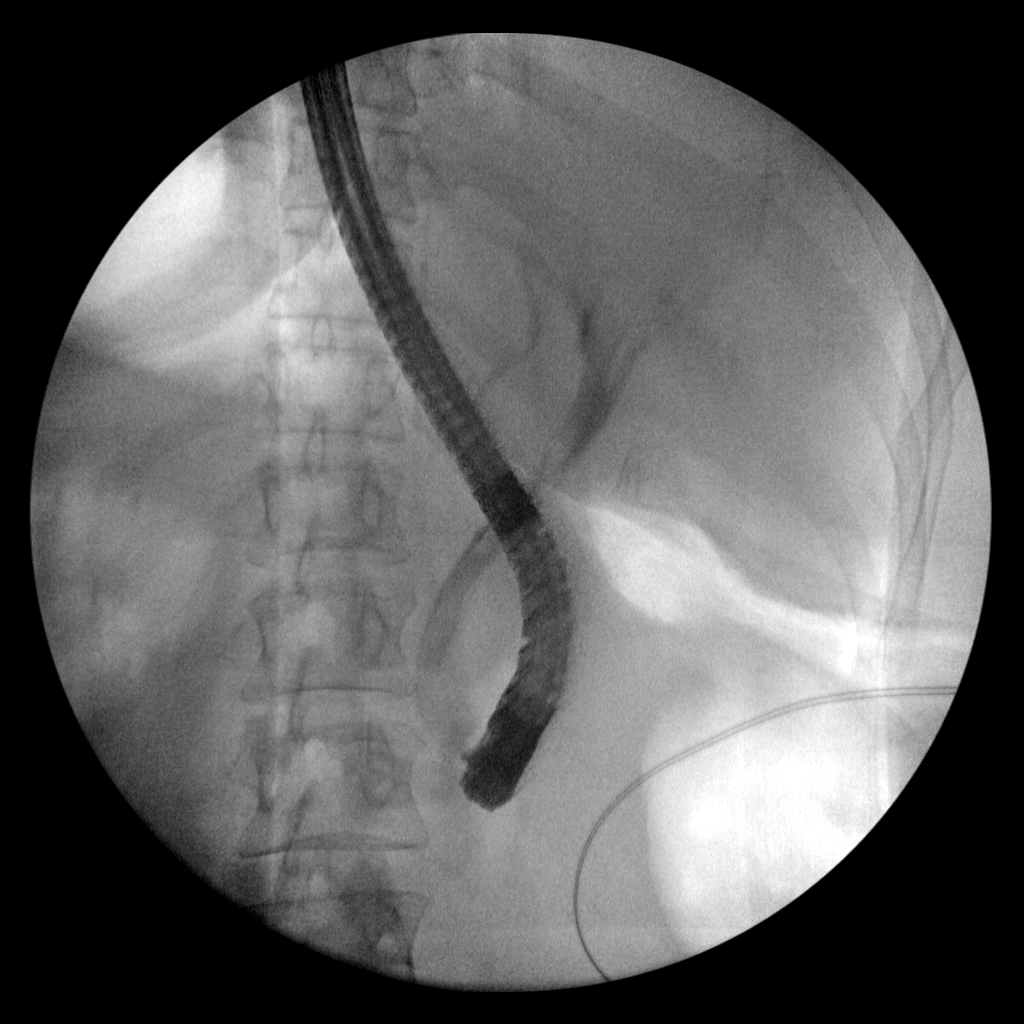

[2 of 2 positions shown; findings below may reference images not displayed]

FINDINGS: A series of fluoroscopic spot images document endoscopic cannulation
and opacification of the CBD. The CBD is incompletely opacified and
partially obscured by the overlying scope. The intrahepatic ducts
are incompletely opacified, appearing decompressed centrally.
Cholecystectomy clips. No evident extravasation.
IMPRESSION: Endoscopic cholangiogram

These images were submitted for radiologic interpretation only.
Please see the procedural report for the amount of contrast and the
fluoroscopy time utilized.

## 2023-03-08 ENCOUNTER — Other Ambulatory Visit: Payer: Self-pay | Admitting: Internal Medicine

## 2023-03-08 DIAGNOSIS — Z1231 Encounter for screening mammogram for malignant neoplasm of breast: Secondary | ICD-10-CM

## 2023-11-15 ENCOUNTER — Other Ambulatory Visit (HOSPITAL_BASED_OUTPATIENT_CLINIC_OR_DEPARTMENT_OTHER): Payer: Self-pay

## 2023-11-22 ENCOUNTER — Other Ambulatory Visit (HOSPITAL_BASED_OUTPATIENT_CLINIC_OR_DEPARTMENT_OTHER): Payer: Self-pay

## 2023-11-22 MED ORDER — COMIRNATY 30 MCG/0.3ML IM SUSY
0.3000 mL | PREFILLED_SYRINGE | Freq: Once | INTRAMUSCULAR | 0 refills | Status: AC
  Filled 2023-11-22: qty 0.3, 1d supply, fill #0

## 2024-01-31 ENCOUNTER — Other Ambulatory Visit (HOSPITAL_BASED_OUTPATIENT_CLINIC_OR_DEPARTMENT_OTHER): Payer: Self-pay

## 2024-01-31 MED ORDER — FLUZONE 0.5 ML IM SUSY
0.5000 mL | PREFILLED_SYRINGE | Freq: Once | INTRAMUSCULAR | 0 refills | Status: AC
  Filled 2024-01-31: qty 0.5, 1d supply, fill #0
